# Patient Record
Sex: Female | Born: 1955 | Race: Black or African American | Hispanic: No | Marital: Single | Smoking: Never smoker
Health system: Southern US, Community
[De-identification: ages and names within clinical notes are randomized; demographics above are authoritative.]

## PROBLEM LIST (undated history)

## (undated) DIAGNOSIS — I10 Essential (primary) hypertension: Secondary | ICD-10-CM

## (undated) DIAGNOSIS — E785 Hyperlipidemia, unspecified: Secondary | ICD-10-CM

## (undated) HISTORY — DX: Essential (primary) hypertension: I10

## (undated) HISTORY — DX: Hyperlipidemia, unspecified: E78.5

---

## 2017-07-30 LAB — TSH: TSH: 0.24 — AB (ref <=5.90)

## 2017-10-07 LAB — TSH: TSH: 0.03 — AB (ref <=5.90)

## 2017-11-16 ENCOUNTER — Encounter: Payer: Self-pay | Admitting: "Endocrinology

## 2017-11-16 ENCOUNTER — Ambulatory Visit (INDEPENDENT_AMBULATORY_CARE_PROVIDER_SITE_OTHER): Payer: Managed Care, Other (non HMO) | Admitting: "Endocrinology

## 2017-11-16 VITALS — BP 122/79 | HR 74 | Ht 69.0 in | Wt 222.0 lb

## 2017-11-16 DIAGNOSIS — R7989 Other specified abnormal findings of blood chemistry: Secondary | ICD-10-CM | POA: Insufficient documentation

## 2017-11-16 NOTE — Progress Notes (Signed)
Endocrinology Consult Note                                            11/16/2017, 3:50 PM   Subjective:    Patient ID: Margaret Castro, female    DOB: 08-31-1955, PCP Verlee MonteWeeks, Susan R, NP   Past Medical History:  Diagnosis Date  . Hyperlipidemia   . Hypertension    History reviewed. No pertinent surgical history. Social History   Socioeconomic History  . Marital status: Married    Spouse name: Not on file  . Number of children: Not on file  . Years of education: Not on file  . Highest education level: Not on file  Occupational History  . Not on file  Social Needs  . Financial resource strain: Not on file  . Food insecurity:    Worry: Not on file    Inability: Not on file  . Transportation needs:    Medical: Not on file    Non-medical: Not on file  Tobacco Use  . Smoking status: Never Smoker  . Smokeless tobacco: Never Used  Substance and Sexual Activity  . Alcohol use: Not Currently  . Drug use: Never  . Sexual activity: Not on file  Lifestyle  . Physical activity:    Days per week: Not on file    Minutes per session: Not on file  . Stress: Not on file  Relationships  . Social connections:    Talks on phone: Not on file    Gets together: Not on file    Attends religious service: Not on file    Active member of club or organization: Not on file    Attends meetings of clubs or organizations: Not on file    Relationship status: Not on file  Other Topics Concern  . Not on file  Social History Narrative  . Not on file   Outpatient Encounter Medications as of 11/16/2017  Medication Sig  . lisinopril-hydrochlorothiazide (PRINZIDE,ZESTORETIC) 10-12.5 MG tablet daily.  . rosuvastatin (CRESTOR) 20 MG tablet 10 mg daily.   No facility-administered encounter medications on file as of 11/16/2017.    ALLERGIES: No Known Allergies  VACCINATION STATUS:  There is no immunization history on file for this patient.  HPI Margaret Castro is 62 y.o. female  who presents today with a medical history as above. she is being seen in consultation for abnormal thyroid function test requested by Verlee MonteWeeks, Susan R, NP. She was found to have suppressed TSH on 2 separate occasions, on July 30, 2017 0.235 and on October 07, 2017 0.032, no associated T4/T3.  -She denies recent weight change, palpitations, tremors.  She reports sleep disturbance, and fatigue.  -She has 2 sisters with thyroid problems, 1 of them status post thyroidectomy on thyroid hormone replacement.  She denies family history of thyroid cancer. -She denies any exposure to neck radiation.  She is not currently on antithyroid medications nor thyroid hormone supplements. -She denies heat/cold intolerance, dysphagia, shortness of breath, change in her voice.    Review of Systems  Constitutional: no weight gain/loss, no fatigue, no subjective hyperthermia, no subjective hypothermia Eyes: no blurry vision, no xerophthalmia ENT: no sore throat, no nodules palpated in throat, no dysphagia/odynophagia, no hoarseness Cardiovascular: no Chest Pain, no Shortness of Breath, no palpitations, no leg swelling Respiratory: no cough, no SOB Gastrointestinal: no  Nausea/Vomiting/Diarhhea Musculoskeletal: no muscle/joint aches Skin: no rashes Neurological: no tremors, no numbness, no tingling, no dizziness Psychiatric: no depression, no anxiety  Objective:    BP 122/79   Pulse 74   Ht 5\' 9"  (1.753 m)   Wt 222 lb (100.7 kg)   BMI 32.78 kg/m   Wt Readings from Last 3 Encounters:  11/16/17 222 lb (100.7 kg)    Physical Exam  Constitutional: + Obese,  not in acute distress, normal state of mind Eyes: PERRLA, EOMI, no exophthalmos ENT: moist mucous membranes, no thyromegaly, no cervical lymphadenopathy Cardiovascular: normal precordial activity, Regular Rate and Rhythm, no Murmur/Rubs/Gallops Respiratory:  adequate breathing efforts, no gross chest deformity, Clear to auscultation  bilaterally Gastrointestinal: abdomen soft, Non -tender, No distension, Bowel Sounds present Musculoskeletal: no gross deformities, strength intact in all four extremities Skin: moist, warm, no rashes Neurological: + Mild tremor of outstretched hands, Deep tendon reflexes normal in all four extremities.  Recent Results (from the past 2160 hour(s))  TSH     Status: Abnormal   Collection Time: 10/07/17 12:00 AM  Result Value Ref Range   TSH 0.03 (A) 0.41 - 5.90      Assessment & Plan:   1. Abnormal thyroid blood test - Margaret Castro  is being seen at a kind request of Weeks, Lenny Pastel, NP. - I have reviewed her available thyroid records and clinically evaluated the patient. - Based on  reviews, she has suppressed TSH on 2 separate occasions , suggestive of hyperthyroidism.  This is not sufficient information to proceed with treatment plan.  -she will need a repeat,  more complete thyroid function tests including TSH, free T4/free T3, and antithyroid antibodies towards confirming the diagnosis.  -she will return in 1 week to review her repeat labs.   If her labs are suggestive of hyperthyroidism, she would be considered for thyroid uptake and scan to confirm diagnosis.  - I did not initiate any new prescriptions today. - I advised patient to maintain close follow up with Verlee Monte, NP for primary care needs. -She does not have gross  goiter, no need for thyroid imaging at this time.  - Time spent with the patient: 45 minutes, of which >50% was spent in obtaining information about her symptoms, reviewing her previous labs, evaluations, and treatments, counseling her about her normal thyroid function tests, and developing a plan to confirm the diagnosis and long term treatment as necessary.  Margaret Castro participated in the discussions, expressed understanding, and voiced agreement with the above plans.  All questions were answered to her satisfaction. she is encouraged to  contact clinic should she have any questions or concerns prior to her return visit.  Follow up plan: Return in about 1 week (around 11/23/2017) for labs today.   Marquis Lunch, MD [facility] [address] [place], [place] [address] Phone: [phone]  Fax: [phone]     11/16/2017, 3:50 PM  This note was partially dictated with voice recognition software. Similar sounding words can be transcribed inadequately or may not  be corrected upon review.

## 2017-11-17 LAB — T4, FREE: FREE T4: 1.7 ng/dL (ref 0.8–1.8)

## 2017-11-17 LAB — THYROGLOBULIN ANTIBODY: Thyroglobulin Ab: 3 IU/mL — ABNORMAL HIGH (ref <=1)

## 2017-11-17 LAB — T3, FREE: T3, Free: 4.3 pg/mL — ABNORMAL HIGH (ref 2.3–4.2)

## 2017-11-17 LAB — THYROID PEROXIDASE ANTIBODY: Thyroperoxidase Ab SerPl-aCnc: 2 IU/mL (ref <9)

## 2017-11-17 LAB — TSH: TSH: 0.01 m[IU]/L — AB (ref 0.40–4.50)

## 2017-11-23 ENCOUNTER — Encounter: Payer: Self-pay | Admitting: "Endocrinology

## 2017-11-23 ENCOUNTER — Ambulatory Visit (INDEPENDENT_AMBULATORY_CARE_PROVIDER_SITE_OTHER): Payer: Managed Care, Other (non HMO) | Admitting: "Endocrinology

## 2017-11-23 VITALS — BP 137/83 | HR 75 | Ht 69.0 in | Wt 222.0 lb

## 2017-11-23 DIAGNOSIS — E0590 Thyrotoxicosis, unspecified without thyrotoxic crisis or storm: Secondary | ICD-10-CM | POA: Diagnosis not present

## 2017-11-23 NOTE — Progress Notes (Signed)
Endocrinology follow-up note                                            11/23/2017, 4:45 PM   Subjective:    Patient ID: Margaret Castro, female    DOB: Jun 19, 1956, PCP Verlee Monte, NP   Past Medical History:  Diagnosis Date  . Hyperlipidemia   . Hypertension    History reviewed. No pertinent surgical history. Social History   Socioeconomic History  . Marital status: Married    Spouse name: Not on file  . Number of children: Not on file  . Years of education: Not on file  . Highest education level: Not on file  Occupational History  . Not on file  Social Needs  . Financial resource strain: Not on file  . Food insecurity:    Worry: Not on file    Inability: Not on file  . Transportation needs:    Medical: Not on file    Non-medical: Not on file  Tobacco Use  . Smoking status: Never Smoker  . Smokeless tobacco: Never Used  Substance and Sexual Activity  . Alcohol use: Not Currently  . Drug use: Never  . Sexual activity: Not on file  Lifestyle  . Physical activity:    Days per week: Not on file    Minutes per session: Not on file  . Stress: Not on file  Relationships  . Social connections:    Talks on phone: Not on file    Gets together: Not on file    Attends religious service: Not on file    Active member of club or organization: Not on file    Attends meetings of clubs or organizations: Not on file    Relationship status: Not on file  Other Topics Concern  . Not on file  Social History Narrative  . Not on file   Outpatient Encounter Medications as of 11/23/2017  Medication Sig  . lisinopril-hydrochlorothiazide (PRINZIDE,ZESTORETIC) 10-12.5 MG tablet daily.  . rosuvastatin (CRESTOR) 20 MG tablet 10 mg daily.   No facility-administered encounter medications on file as of 11/23/2017.    ALLERGIES: No Known Allergies  VACCINATION STATUS:  There is no immunization history on file for this patient.  HPI Margaret Castro is 62 y.o. female  who presents today with a medical history as above. she is returning with repeat full set thyroid function test after she was seen in consultation for incomplete abnormal thyroid function test.   Her repeat labs suggest hyperthyroidism. -She denies recent weight change, palpitations, tremors.  She reports sleep disturbance, and fatigue.  -She has 2 sisters with thyroid problems, 1 of them status post thyroidectomy on thyroid hormone replacement.  She denies family history of thyroid cancer. -She denies any exposure to neck radiation.  She is not currently on antithyroid medications nor thyroid hormone supplements. -She denies heat/cold intolerance, dysphagia, shortness of breath, change in her voice.   Review of Systems  Constitutional: + Steady weight,  + fatigue, no subjective hyperthermia, no subjective hypothermia Eyes: no blurry vision, no xerophthalmia ENT: no sore throat, no nodules palpated in throat, no dysphagia/odynophagia, no hoarseness Cardiovascular: + No chest pain, no palpitations.  Respiratory: no cough, no SOB Gastrointestinal: no Nausea/Vomiting/Diarhhea Musculoskeletal: no muscle/joint aches Skin: no rashes Neurological: no tremors, no numbness, no tingling, no dizziness Psychiatric: no  depression, no anxiety  Objective:    BP 137/83   Pulse 75   Ht  (1.753 m)   Wt 222 lb (100.7 kg)   BMI 32.78 kg/m   Wt Readings from Last 3 Encounters:  11/23/17 222 lb (100.7 kg)  11/16/17 222 lb (100.7 kg)    Physical Exam  Constitutional: + Obese,  not in acute distress, normal state of mind Eyes: PERRLA, EOMI, no exophthalmos ENT: moist mucous membranes, no thyromegaly, no cervical lymphadenopathy  Musculoskeletal: no gross deformities, strength intact in all four extremities Skin: moist, warm, no rashes Neurological: + Mild tremors of outstretched hands   Recent Results (from the past 2160 hour(s))  TSH     Status: Abnormal   Collection Time: 10/07/17 12:00  AM  Result Value Ref Range   TSH 0.03 (A) 0.41 - 5.90  Thyroid peroxidase antibody     Status: None   Collection Time: 11/16/17  4:03 PM  Result Value Ref Range   Thyroperoxidase Ab SerPl-aCnc 2 <9 IU/mL  T3, free     Status: Abnormal   Collection Time: 11/16/17  4:03 PM  Result Value Ref Range   T3, Free 4.3 (H) 2.3 - 4.2 pg/mL  T4, free     Status: None   Collection Time: 11/16/17  4:03 PM  Result Value Ref Range   Free T4 1.7 0.8 - 1.8 ng/dL  TSH     Status: Abnormal   Collection Time: 11/16/17  4:03 PM  Result Value Ref Range   TSH 0.01 (L) 0.40 - 4.50 mIU/L  Thyroglobulin antibody     Status: Abnormal   Collection Time: 11/16/17  4:03 PM  Result Value Ref Range   Thyroglobulin Ab 3 (H) < or = 1 IU/mL     Assessment & Plan:   1.  Hyperthyroidism -Her repeat thyroid function tests are suggestive of hyperthyroidism of autoimmune etiology. -She may require definitive treatment with preferably radioactive iodine ablation. -She will need a confirmatory test with thyroid uptake and scan which will be scheduled to be done as soon as possible.  -she will return in 2  weekS to review her repeat labs.  - I did not initiate any new prescriptions today. - I advised patient to maintain close follow up with [name], NP for primary care needs.  Margaret Castro participated in the discussions, expressed understanding, and voiced agreement with the above plans.  All questions were answered to her satisfaction. she is encouraged to contact clinic should she have any questions or concerns prior to her return visit.  Follow up plan: Return in about 2 weeks (around 12/07/2017) for follow up with thyroid uptake and scan.   Marquis Lunch, MD [facility] [address] [place], [place] [address] Phone: [phone]  Fax: [phone]     11/23/2017, 4:45 PM  This note was partially dictated with voice recognition software.  Similar sounding words can be transcribed inadequately or may not  be corrected upon review.

## 2017-11-30 ENCOUNTER — Ambulatory Visit: Payer: Managed Care, Other (non HMO) | Admitting: "Endocrinology

## 2017-12-01 ENCOUNTER — Encounter (HOSPITAL_COMMUNITY)
Admission: RE | Admit: 2017-12-01 | Discharge: 2017-12-01 | Disposition: A | Discharge status: Home / Self Care | Payer: Managed Care, Other (non HMO) | Source: Ambulatory Visit | Attending: "Endocrinology | Admitting: "Endocrinology

## 2017-12-01 ENCOUNTER — Encounter (HOSPITAL_COMMUNITY): Payer: Self-pay

## 2017-12-01 DIAGNOSIS — E0590 Thyrotoxicosis, unspecified without thyrotoxic crisis or storm: Secondary | ICD-10-CM | POA: Diagnosis not present

## 2017-12-01 MED ORDER — SODIUM IODIDE I 131 CAPSULE
300.0000 | Freq: Once | INTRAVENOUS | Status: AC | PRN
  Administered 2017-12-01: 300 via ORAL

## 2017-12-02 ENCOUNTER — Encounter (HOSPITAL_COMMUNITY)
Admission: RE | Admit: 2017-12-02 | Discharge: 2017-12-02 | Disposition: A | Discharge status: Home / Self Care | Payer: Managed Care, Other (non HMO) | Source: Ambulatory Visit | Attending: "Endocrinology | Admitting: "Endocrinology

## 2017-12-08 ENCOUNTER — Ambulatory Visit (INDEPENDENT_AMBULATORY_CARE_PROVIDER_SITE_OTHER): Payer: Managed Care, Other (non HMO) | Admitting: "Endocrinology

## 2017-12-08 ENCOUNTER — Encounter: Payer: Self-pay | Admitting: "Endocrinology

## 2017-12-08 VITALS — BP 138/82 | HR 76 | Ht 69.0 in | Wt 223.0 lb

## 2017-12-08 DIAGNOSIS — E0590 Thyrotoxicosis, unspecified without thyrotoxic crisis or storm: Secondary | ICD-10-CM

## 2017-12-08 MED ORDER — METHIMAZOLE 5 MG PO TABS: 5.0000 mg | ORAL_TABLET | Freq: Every day | ORAL | 3 refills | Status: DC

## 2017-12-08 NOTE — Progress Notes (Signed)
Endocrinology follow-up note                                            12/08/2017, 4:47 PM   Subjective:    Patient ID: Margaret Castro, female    DOB: 1955/10/13, PCP Verlee Monte, NP   Past Medical History:  Diagnosis Date  . Hyperlipidemia   . Hypertension    History reviewed. No pertinent surgical history. Social History   Socioeconomic History  . Marital status: Single    Spouse name: Not on file  . Number of children: Not on file  . Years of education: Not on file  . Highest education level: Not on file  Occupational History  . Not on file  Social Needs  . Financial resource strain: Not on file  . Food insecurity:    Worry: Not on file    Inability: Not on file  . Transportation needs:    Medical: Not on file    Non-medical: Not on file  Tobacco Use  . Smoking status: Never Smoker  . Smokeless tobacco: Never Used  Substance and Sexual Activity  . Alcohol use: Not Currently  . Drug use: Never  . Sexual activity: Not on file  Lifestyle  . Physical activity:    Days per week: Not on file    Minutes per session: Not on file  . Stress: Not on file  Relationships  . Social connections:    Talks on phone: Not on file    Gets together: Not on file    Attends religious service: Not on file    Active member of club or organization: Not on file    Attends meetings of clubs or organizations: Not on file    Relationship status: Not on file  Other Topics Concern  . Not on file  Social History Narrative  . Not on file   Outpatient Encounter Medications as of 12/08/2017  Medication Sig  . lisinopril-hydrochlorothiazide (PRINZIDE,ZESTORETIC) 10-12.5 MG tablet daily.  . methimazole (TAPAZOLE) 5 MG tablet Take 1 tablet (5 mg total) by mouth daily.  . rosuvastatin (CRESTOR) 20 MG tablet 10 mg daily.   No facility-administered encounter medications on file as of 12/08/2017.    ALLERGIES: No Known Allergies  VACCINATION STATUS:  There is no  immunization history on file for this patient.  HPI Margaret Castro is 62 y.o. female who presents today with a medical history as above. she is returning with thyroid uptake and scan in work-up of her recent diagnosis of hyperthyroidism.  -She is not on any antithyroid medication at this time.  Her recent thyroid function tests were consistent with hyperthyroidism. -She denies recent weight change, palpitations, tremors.  She reports sleep disturbance, and fatigue.  -She has 2 sisters with thyroid problems, 1 of them status post thyroidectomy on thyroid hormone replacement.  She denies family history of thyroid cancer. -She denies any exposure to neck radiation.  She is not currently on antithyroid medications nor thyroid hormone supplements. -She denies heat/cold intolerance, dysphagia, shortness of breath, change in her voice.   Review of Systems  Constitutional: + Steady weight,  + fatigue, no subjective hyperthermia, no subjective hypothermia Eyes: no blurry vision, no xerophthalmia ENT: no sore throat, no nodules palpated in throat, no dysphagia/odynophagia, no hoarseness Cardiovascular: + No chest pain, no palpitations.  Respiratory: no  cough, no SOB Gastrointestinal: no Nausea/Vomiting/Diarhhea Musculoskeletal: no muscle/joint aches Skin: no rashes Neurological: no tremors, no numbness, no tingling, no dizziness Psychiatric: no depression, no anxiety  Objective:    BP 138/82   Pulse 76   Ht  (1.753 m)   Wt 223 lb (101.2 kg)   BMI 32.93 kg/m   Wt Readings from Last 3 Encounters:  12/08/17 223 lb (101.2 kg)  11/23/17 222 lb (100.7 kg)  11/16/17 222 lb (100.7 kg)    Physical Exam  Constitutional: + Obese,  not in acute distress, normal state of mind Eyes: PERRLA, EOMI, no exophthalmos ENT: moist mucous membranes, no thyromegaly, no cervical lymphadenopathy  Musculoskeletal: no gross deformities, strength intact in all four extremities Skin: moist, warm, no  rashes Neurological: + Mild tremors of outstretched hands.      Recent Results (from the past 2160 hour(s))  TSH     Status: Abnormal   Collection Time: 10/07/17 12:00 AM  Result Value Ref Range   TSH 0.03 (A) 0.41 - 5.90  Thyroid peroxidase antibody     Status: None   Collection Time: 11/16/17  4:03 PM  Result Value Ref Range   Thyroperoxidase Ab SerPl-aCnc 2 <9 IU/mL  T3, free     Status: Abnormal   Collection Time: 11/16/17  4:03 PM  Result Value Ref Range   T3, Free 4.3 (H) 2.3 - 4.2 pg/mL  T4, free     Status: None   Collection Time: 11/16/17  4:03 PM  Result Value Ref Range   Free T4 1.7 0.8 - 1.8 ng/dL  TSH     Status: Abnormal   Collection Time: 11/16/17  4:03 PM  Result Value Ref Range   TSH 0.01 (L) 0.40 - 4.50 mIU/L  Thyroglobulin antibody     Status: Abnormal   Collection Time: 11/16/17  4:03 PM  Result Value Ref Range   Thyroglobulin Ab 3 (H) < or = 1 IU/mL     Assessment & Plan:   1.  Hyperthyroidism -Her repeat thyroid function tests are suggestive of hyperthyroidism of autoimmune etiology. -Thyroid uptake and scan is consistent with uniform uptake of 27.4% with no focal abnormalities. -Based on these findings, she will not require ablative therapy.  However, she would benefit from low-dose Tapazole in an attempt to control her thyroid back to normal. -I discussed and initiated Tapazole 5 mg p.o. daily with plan to repeat thyroid function test in 3 months. -Side effects and expectations discussed with her.  - I advised patient to maintain close follow up with [name], NP for primary care needs.  [name] participated in the discussions, expressed understanding, and voiced agreement with the above plans.  All questions were answered to her satisfaction. she is encouraged to contact clinic should she have any questions or concerns prior to her return visit.  Follow up plan: Return in about 3 months (around 03/10/2018) for follow up with  pre-visit labs.   Marquis Lunch, MD [facility] [address] [place], [place] [address] Phone: [phone]  Fax: [phone]     12/08/2017, 4:47 PM  This note was partially dictated with voice recognition software. Similar sounding words can be transcribed inadequately or may not  be corrected upon review.

## 2018-03-11 ENCOUNTER — Ambulatory Visit: Payer: Managed Care, Other (non HMO) | Admitting: "Endocrinology

## 2018-03-23 ENCOUNTER — Encounter: Payer: Self-pay | Admitting: "Endocrinology

## 2018-03-23 ENCOUNTER — Ambulatory Visit (INDEPENDENT_AMBULATORY_CARE_PROVIDER_SITE_OTHER): Payer: Managed Care, Other (non HMO) | Admitting: "Endocrinology

## 2018-03-23 VITALS — BP 124/80 | HR 80 | Ht 69.0 in | Wt 221.0 lb

## 2018-03-23 DIAGNOSIS — E0590 Thyrotoxicosis, unspecified without thyrotoxic crisis or storm: Secondary | ICD-10-CM | POA: Diagnosis not present

## 2018-03-23 MED ORDER — METHIMAZOLE 5 MG PO TABS: 2.5000 mg | ORAL_TABLET | Freq: Every day | ORAL | 3 refills | Status: DC

## 2018-03-23 NOTE — Progress Notes (Signed)
Endocrinology follow-up note                                            03/23/2018, 11:31 AM   Subjective:    Patient ID: Margaret Castro, female    DOB: 05/21/1956, PCP Verlee MonteWeeks, Susan R, NP   Past Medical History:  Diagnosis Date  . Hyperlipidemia   . Hypertension    History reviewed. No pertinent surgical history. Social History   Socioeconomic History  . Marital status: Single    Spouse name: Not on file  . Number of children: Not on file  . Years of education: Not on file  . Highest education level: Not on file  Occupational History  . Not on file  Social Needs  . Financial resource strain: Not on file  . Food insecurity:    Worry: Not on file    Inability: Not on file  . Transportation needs:    Medical: Not on file    Non-medical: Not on file  Tobacco Use  . Smoking status: Never Smoker  . Smokeless tobacco: Never Used  Substance and Sexual Activity  . Alcohol use: Not Currently  . Drug use: Never  . Sexual activity: Not on file  Lifestyle  . Physical activity:    Days per week: Not on file    Minutes per session: Not on file  . Stress: Not on file  Relationships  . Social connections:    Talks on phone: Not on file    Gets together: Not on file    Attends religious service: Not on file    Active member of club or organization: Not on file    Attends meetings of clubs or organizations: Not on file    Relationship status: Not on file  Other Topics Concern  . Not on file  Social History Narrative  . Not on file   Outpatient Encounter Medications as of 03/23/2018  Medication Sig  . lisinopril-hydrochlorothiazide (PRINZIDE,ZESTORETIC) 10-12.5 MG tablet daily.  . methimazole (TAPAZOLE) 5 MG tablet Take 0.5 tablets (2.5 mg total) by mouth daily.  . rosuvastatin (CRESTOR) 20 MG tablet 10 mg daily.  . [DISCONTINUED] methimazole (TAPAZOLE) 5 MG tablet Take 1 tablet (5 mg total) by mouth daily.   No facility-administered encounter medications on  file as of 03/23/2018.    ALLERGIES: No Known Allergies  VACCINATION STATUS:  There is no immunization history on file for this patient.  HPI Margaret Castro is 62 y.o. female who presents today with a medical history as above. she is returning with repeat thyroid function tests for a follow-up of hyperthyroidism.  She is on methimazole 5 mg p.o. daily.  -She tolerates this medication very well.  She feels better.  No new complaints.  -She denies recent weight change, palpitations, tremors.  She sleeps better, denies fatigue.     -She has 2 sisters with thyroid problems, 1 of them status post thyroidectomy on thyroid hormone replacement.  She denies family history of thyroid cancer. -She denies any exposure to neck radiation.   -She denies heat/cold intolerance, dysphagia, shortness of breath, change in her voice.   Review of Systems  Constitutional: + Steady weight,  + fatigue, no subjective hyperthermia, no subjective hypothermia Eyes: no blurry vision, no xerophthalmia ENT: no sore throat, no nodules palpated in throat, no dysphagia/odynophagia, no hoarseness  Cardiovascular:  No chest pain, no palpitations.  Respiratory: no cough, no SOB Gastrointestinal: no Nausea/Vomiting/Diarhhea Musculoskeletal: no muscle/joint aches Skin: no rashes Neurological: no tremors, no numbness, no tingling, no dizziness Psychiatric: no depression, no anxiety  Objective:    BP 124/80   Pulse 80   Ht 5\' 9"  (1.753 m)   Wt 221 lb (100.2 kg)   BMI 32.64 kg/m   Wt Readings from Last 3 Encounters:  03/23/18 221 lb (100.2 kg)  12/08/17 223 lb (101.2 kg)  11/23/17 222 lb (100.7 kg)    Physical Exam  Constitutional: + Obese,  not in acute distress, normal state of mind Eyes: PERRLA, EOMI, no exophthalmos ENT: moist mucous membranes, no thyromegaly, no cervical lymphadenopathy  Musculoskeletal: no gross deformities, strength intact in all four extremities Skin: moist, warm, no  rashes Neurological: - tremors of outstretched hands.     March 03, 2018 labs showed normal CBC, free T4 1.2.  Assessment & Plan:   1.  Hyperthyroidism -She is responding to low-dose methimazole therapy, with clinical and biochemical stabilization.  She has hyperthyroidism of autoimmune etiology. -Thyroid uptake and scan is consistent with uniform uptake of 27.4% with no focal abnormalities.  She did not require ablative treatment. I advised her to lower her methimazole to 2.5 mg p.o. daily with plan to repeat thyroid function test in 3 to 4 months.  -Side effects and expectations discussed with her.  - I advised patient to maintain close follow up with [name], NP for primary care needs.  Margaret Ave participated in the discussions, expressed understanding, and voiced agreement with the above plans.  All questions were answered to her satisfaction. she is encouraged to contact clinic should she have any questions or concerns prior to her return visit.  Follow up plan: Return in about 4 months (around 07/23/2018) for Follow up with Pre-visit Labs.   Marquis Lunch, MD [facility] [address] [place], [place] [address] Phone: [phone]  Fax: [phone]     03/23/2018, 11:31 AM  This note was partially dictated with voice recognition software. Similar sounding words can be transcribed inadequately or may not  be corrected upon review.

## 2018-07-18 LAB — BASIC METABOLIC PANEL
BUN: 14 (ref 4–21)
Creatinine: 1 (ref 0.5–1.1)

## 2018-07-18 LAB — LIPID PANEL
Cholesterol: 148 (ref 0–200)
HDL: 59 (ref 35–70)
LDL Cholesterol: 79
Triglycerides: 49 (ref 40–160)

## 2018-07-18 LAB — TSH: TSH: 0.01 — AB (ref <=5.90)

## 2018-07-25 ENCOUNTER — Encounter: Payer: Self-pay | Admitting: "Endocrinology

## 2018-07-25 ENCOUNTER — Ambulatory Visit (INDEPENDENT_AMBULATORY_CARE_PROVIDER_SITE_OTHER): Payer: Managed Care, Other (non HMO) | Admitting: "Endocrinology

## 2018-07-25 VITALS — BP 127/80 | HR 76 | Ht 69.0 in | Wt 215.0 lb

## 2018-07-25 DIAGNOSIS — E0590 Thyrotoxicosis, unspecified without thyrotoxic crisis or storm: Secondary | ICD-10-CM | POA: Diagnosis not present

## 2018-07-25 MED ORDER — METHIMAZOLE 5 MG PO TABS
5.0000 mg | ORAL_TABLET | Freq: Every day | ORAL | 3 refills | Status: DC
Start: 2018-07-25 — End: 2018-09-02

## 2018-07-25 NOTE — Progress Notes (Signed)
Endocrinology follow-up note                                            07/25/2018, 1:27 PM   Subjective:    Patient ID: Margaret AveVanessa D Guardado, female    DOB: 1956/04/07, PCP Verlee MonteWeeks, Susan R, NP   Past Medical History:  Diagnosis Date  . Hyperlipidemia   . Hypertension    History reviewed. No pertinent surgical history. Social History   Socioeconomic History  . Marital status: Single    Spouse name: Not on file  . Number of children: Not on file  . Years of education: Not on file  . Highest education level: Not on file  Occupational History  . Not on file  Social Needs  . Financial resource strain: Not on file  . Food insecurity:    Worry: Not on file    Inability: Not on file  . Transportation needs:    Medical: Not on file    Non-medical: Not on file  Tobacco Use  . Smoking status: Never Smoker  . Smokeless tobacco: Never Used  Substance and Sexual Activity  . Alcohol use: Not Currently  . Drug use: Never  . Sexual activity: Not on file  Lifestyle  . Physical activity:    Days per week: Not on file    Minutes per session: Not on file  . Stress: Not on file  Relationships  . Social connections:    Talks on phone: Not on file    Gets together: Not on file    Attends religious service: Not on file    Active member of club or organization: Not on file    Attends meetings of clubs or organizations: Not on file    Relationship status: Not on file  Other Topics Concern  . Not on file  Social History Narrative  . Not on file   Outpatient Encounter Medications as of 07/25/2018  Medication Sig  . lisinopril-hydrochlorothiazide (PRINZIDE,ZESTORETIC) 10-12.5 MG tablet daily.  . methimazole (TAPAZOLE) 5 MG tablet Take 1 tablet (5 mg total) by mouth daily.  . rosuvastatin (CRESTOR) 20 MG tablet 10 mg daily.  . [DISCONTINUED] methimazole (TAPAZOLE) 5 MG tablet Take 0.5 tablets (2.5 mg total) by mouth daily.   No facility-administered encounter medications on  file as of 07/25/2018.    ALLERGIES: No Known Allergies  VACCINATION STATUS:  There is no immunization history on file for this patient.  HPI Margaret Castro is 62 y.o. female who presents today with a medical history as above. she is returning with repeat thyroid function tests for a follow-up of hyperthyroidism.  She is on methimazole 2.5 mg p.o. daily.  She admits that she has been inconsistent taking this medication.  She skips 2-3 doses per week.  She presents with unintentional weight loss of 6 pounds, and tremors.  -She denies palpitations nor heat intolerance.   -She has 2 sisters with thyroid problems, 1 of them status post thyroidectomy on thyroid hormone replacement.  She denies family history of thyroid cancer. -She denies any exposure to neck radiation.   -She denies heat/cold intolerance, dysphagia, shortness of breath, change in her voice.   Review of Systems  Constitutional: + weight loss  + fatigue, no subjective hyperthermia, no subjective hypothermia Eyes: no blurry vision, no xerophthalmia ENT: no sore throat, no nodules palpated  in throat, no dysphagia/odynophagia, no hoarseness Musculoskeletal: no muscle/joint aches Skin: no rashes Neurological: no tremors, no numbness, no tingling, no dizziness Psychiatric: no depression, no anxiety  Objective:    BP 127/80   Pulse 76   Ht 5\' 9"  (1.753 m)   Wt 215 lb (97.5 kg)   BMI 31.75 kg/m   Wt Readings from Last 3 Encounters:  07/25/18 215 lb (97.5 kg)  03/23/18 221 lb (100.2 kg)  12/08/17 223 lb (101.2 kg)    Physical Exam  Constitutional: + Obese,  not in acute distress, normal state of mind Eyes: PERRLA, EOMI, no exophthalmos ENT: moist mucous membranes, no thyromegaly, no cervical lymphadenopathy  Musculoskeletal: no gross deformities, strength intact in all four extremities Skin: moist, warm, no rashes Neurological: +tremors of outstretched hands.   Recent Results (from the past 2160 hour(s))   Basic metabolic panel     Status: None   Collection Time: 07/18/18 12:00 AM  Result Value Ref Range   BUN 14 4 - 21   Creatinine 1.0 0.5 - 1.1  Lipid panel     Status: None   Collection Time: 07/18/18 12:00 AM  Result Value Ref Range   Triglycerides 49 40 - 160   Cholesterol 148 0 - 200   HDL 59 35 - 70   LDL Cholesterol 79   TSH     Status: Abnormal   Collection Time: 07/18/18 12:00 AM  Result Value Ref Range   TSH 0.01 (A) 0.41 - 5.90      March 03, 2018 labs showed normal CBC, free T4 1.2.  Assessment & Plan:   1.  Hyperthyroidism -She presents with reversed course of hyperthyroidism clinically with significantly suppressed TSH, this is mainly due to her inconsistency in taking her methimazole.  I approached her with better engagement and increased methimazole to 5 mg p.o. daily with plan to repeat thyroid function test in 8 weeks.    She has hyperthyroidism of autoimmune etiology. -Thyroid uptake and scan is consistent with uniform uptake of 27.4% with no focal abnormalities.  She did not require ablative treatment.  -She will be considered for radioactive iodine thyroid ablation if her response to consistent use of methimazole is not satisfactory.   - I advised patient to maintain close follow up with Tania AdeWeeks, Lenny PastelSusan R, NP for primary care needs.  Margaret AveVanessa D Labarbera participated in the discussions, expressed understanding, and voiced agreement with the above plans.  All questions were answered to her satisfaction. she is encouraged to contact clinic should she have any questions or concerns prior to her return visit.  Follow up plan: Return in about 9 weeks (around 09/26/2018) for Follow up with Pre-visit Labs.   Marquis LunchGebre Samera Macy, MD [facility] [address] [place], [place] [address] Phone: [phone]  Fax: [phone]     07/25/2018, 1:27 PM  This note was partially dictated with voice recognition software. Similar  sounding words can be transcribed inadequately or may not  be corrected upon review.

## 2018-09-02 ENCOUNTER — Other Ambulatory Visit: Payer: Self-pay | Admitting: "Endocrinology

## 2018-09-26 ENCOUNTER — Encounter: Payer: Self-pay | Admitting: "Endocrinology

## 2018-09-26 ENCOUNTER — Ambulatory Visit (INDEPENDENT_AMBULATORY_CARE_PROVIDER_SITE_OTHER): Payer: Managed Care, Other (non HMO) | Admitting: "Endocrinology

## 2018-09-26 VITALS — BP 129/80 | HR 93 | Resp 12 | Ht 69.0 in | Wt 218.8 lb

## 2018-09-26 DIAGNOSIS — E0590 Thyrotoxicosis, unspecified without thyrotoxic crisis or storm: Secondary | ICD-10-CM | POA: Diagnosis not present

## 2018-09-26 MED ORDER — METHIMAZOLE 5 MG PO TABS: 5.0000 mg | ORAL_TABLET | Freq: Every day | ORAL | 1 refills | Status: DC

## 2018-09-26 NOTE — Progress Notes (Signed)
Endocrinology follow-up note                                            09/26/2018, 3:41 PM   Subjective:    Patient ID: Margaret Castro, female    DOB: 1956-04-22, PCP Verlee Monte, NP   Past Medical History:  Diagnosis Date  . Hyperlipidemia   . Hypertension    History reviewed. No pertinent surgical history. Social History   Socioeconomic History  . Marital status: Single    Spouse name: Not on file  . Number of children: Not on file  . Years of education: Not on file  . Highest education level: Not on file  Occupational History  . Not on file  Social Needs  . Financial resource strain: Not on file  . Food insecurity:    Worry: Not on file    Inability: Not on file  . Transportation needs:    Medical: Not on file    Non-medical: Not on file  Tobacco Use  . Smoking status: Never Smoker  . Smokeless tobacco: Never Used  Substance and Sexual Activity  . Alcohol use: Not Currently  . Drug use: Never  . Sexual activity: Not on file  Lifestyle  . Physical activity:    Days per week: Not on file    Minutes per session: Not on file  . Stress: Not on file  Relationships  . Social connections:    Talks on phone: Not on file    Gets together: Not on file    Attends religious service: Not on file    Active member of club or organization: Not on file    Attends meetings of clubs or organizations: Not on file    Relationship status: Not on file  Other Topics Concern  . Not on file  Social History Narrative  . Not on file   Outpatient Encounter Medications as of 09/26/2018  Medication Sig  . lisinopril-hydrochlorothiazide (PRINZIDE,ZESTORETIC) 10-12.5 MG tablet daily.  . methimazole (TAPAZOLE) 5 MG tablet Take 1 tablet (5 mg total) by mouth daily.  . rosuvastatin (CRESTOR) 20 MG tablet 10 mg daily.  . [DISCONTINUED] methimazole (TAPAZOLE) 5 MG tablet Take 1 tablet (5 mg total) by mouth daily.   No facility-administered encounter medications on file as of  09/26/2018.    ALLERGIES: No Known Allergies  VACCINATION STATUS:  There is no immunization history on file for this patient.  HPI Margaret Castro is 63 y.o. female who presents today with a medical history as above. she is returning with repeat thyroid function tests for a follow-up of hyperthyroidism.  She is on methimazole 5 mg p.o. daily .  She reports better consistency taking this medication this time.   -She reports feeling better, denies heat intolerance , nurse, palpitations.  -She has 2 sisters with thyroid problems, 1 of them status post thyroidectomy on thyroid hormone replacement.  She denies family history of thyroid cancer. -She denies any exposure to neck radiation.   -She denies heat/cold intolerance, dysphagia, shortness of breath, change in her voice.   Review of Systems  Constitutional: + Minimally fluctuating body weight, - fatigue, no subjective hyperthermia, no subjective hypothermia Eyes: no blurry vision, no xerophthalmia ENT: no sore throat, no nodules palpated in throat, no dysphagia/odynophagia, no hoarseness Musculoskeletal: no muscle/joint aches Skin: no rashes Neurological:  no tremors, no numbness, no tingling, no dizziness Psychiatric: no depression, no anxiety  Objective:    BP 129/80   Pulse 93   Resp 12   Ht 5\' 9"  (1.753 m)   Wt 218 lb 12.8 oz (99.2 kg)   SpO2 100% Comment: room air  BMI 32.31 kg/m   Wt Readings from Last 3 Encounters:  09/26/18 218 lb 12.8 oz (99.2 kg)  07/25/18 215 lb (97.5 kg)  03/23/18 221 lb (100.2 kg)    Physical Exam  Constitutional: + Obese,  not in acute distress, normal state of mind Eyes: PERRLA, EOMI, no exophthalmos ENT: moist mucous membranes, no thyromegaly, no cervical lymphadenopathy  Musculoskeletal: no gross deformities, strength intact in all four extremities Skin: moist, warm, no rashes Neurological: +tremors of outstretched hands.    Recent Results (from the past 2160 hour(s))  Basic  metabolic panel     Status: None   Collection Time: 07/18/18 12:00 AM  Result Value Ref Range   BUN 14 4 - 21   Creatinine 1.0 0.5 - 1.1  Lipid panel     Status: None   Collection Time: 07/18/18 12:00 AM  Result Value Ref Range   Triglycerides 49 40 - 160   Cholesterol 148 0 - 200   HDL 59 35 - 70   LDL Cholesterol 79   TSH     Status: Abnormal   Collection Time: 07/18/18 12:00 AM  Result Value Ref Range   TSH 0.01 (A) 0.41 - 5.90      March 03, 2018 labs showed normal CBC, free T4 1.2.  Assessment & Plan:   1.  Hyperthyroidism -She presents with significantly improved thyroid function tests.  She is advised to continue methimazole 5 mg p.o. daily at breakfast with plan to repeat TSH/free T4 in 3 months with office visit.     She has hyperthyroidism of autoimmune etiology. -Thyroid uptake and scan is consistent with uniform uptake of 27.4% with no focal abnormalities.  She did not require ablative treatment.  -She will be considered for radioactive iodine thyroid ablation if her response to consistent use of methimazole is not satisfactory.   - I advised patient to maintain close follow up with [name], NP for primary care needs.   [name] participated in the discussions, expressed understanding, and voiced agreement with the above plans.  All questions were answered to her satisfaction. she is encouraged to contact clinic should she have any questions or concerns prior to her return visit.   Follow up plan: Return in about 3 months (around 12/27/2018) for Follow up with Pre-visit Labs.   Marquis Lunch, MD [facility] [address] [place], [place] [address] Phone: [phone]  Fax: [phone]     09/26/2018, 3:41 PM  This note was partially dictated with voice recognition software. Similar sounding words can be transcribed inadequately or may not  be corrected upon review.

## 2018-12-27 ENCOUNTER — Ambulatory Visit: Payer: Managed Care, Other (non HMO) | Admitting: "Endocrinology

## 2019-01-06 LAB — BASIC METABOLIC PANEL
BUN: 16 (ref 4–21)
Creatinine: 1.1 (ref 0.5–1.1)

## 2019-01-06 LAB — LIPID PANEL
Cholesterol: 175 (ref 0–200)
HDL: 69 (ref 35–70)
LDL Cholesterol: 95
Triglycerides: 55 (ref 40–160)

## 2019-01-07 LAB — TSH: TSH: 1.14 (ref 0.41–5.90)

## 2019-01-13 ENCOUNTER — Ambulatory Visit (INDEPENDENT_AMBULATORY_CARE_PROVIDER_SITE_OTHER): Payer: Managed Care, Other (non HMO) | Admitting: "Endocrinology

## 2019-01-13 ENCOUNTER — Encounter: Payer: Self-pay | Admitting: "Endocrinology

## 2019-01-13 ENCOUNTER — Other Ambulatory Visit: Payer: Self-pay

## 2019-01-13 DIAGNOSIS — E0590 Thyrotoxicosis, unspecified without thyrotoxic crisis or storm: Secondary | ICD-10-CM | POA: Diagnosis not present

## 2019-01-13 NOTE — Progress Notes (Signed)
01/13/2019, 1:20 PM                                Endocrinology Telehealth Visit Follow up Note -During COVID -19 Pandemic  I connected with Margaret Castro on 01/13/2019   by telephone and verified that I am speaking with the correct person using two identifiers. Margaret Castro, 05-29-1956. she has verbally consented to this visit. All issues noted in this document were discussed and addressed. The format was not optimal for physical exam.   Subjective:    Patient ID: Margaret Castro, female    DOB: Aug 21, 1955, PCP Renne Crigler, NP   Past Medical History:  Diagnosis Date  . Hyperlipidemia   . Hypertension    History reviewed. No pertinent surgical history. Social History   Socioeconomic History  . Marital status: Single    Spouse name: Not on file  . Number of children: Not on file  . Years of education: Not on file  . Highest education level: Not on file  Occupational History  . Not on file  Social Needs  . Financial resource strain: Not on file  . Food insecurity    Worry: Not on file    Inability: Not on file  . Transportation needs    Medical: Not on file    Non-medical: Not on file  Tobacco Use  . Smoking status: Never Smoker  . Smokeless tobacco: Never Used  Substance and Sexual Activity  . Alcohol use: Not Currently  . Drug use: Never  . Sexual activity: Not on file  Lifestyle  . Physical activity    Days per week: Not on file    Minutes per session: Not on file  . Stress: Not on file  Relationships  . Social Herbalist on phone: Not on file    Gets together: Not on file    Attends religious service: Not on file    Active member of club or organization: Not on file    Attends meetings of clubs or organizations: Not on file    Relationship status: Not on file  Other Topics Concern  . Not on file  Social History Narrative  . Not on file   Outpatient Encounter Medications as of  01/13/2019  Medication Sig  . lisinopril-hydrochlorothiazide (PRINZIDE,ZESTORETIC) 10-12.5 MG tablet daily.  . rosuvastatin (CRESTOR) 20 MG tablet 10 mg daily.  . [DISCONTINUED] methimazole (TAPAZOLE) 5 MG tablet Take 1 tablet (5 mg total) by mouth daily.   No facility-administered encounter medications on file as of 01/13/2019.    ALLERGIES: No Known Allergies  VACCINATION STATUS:  There is no immunization history on file for this patient.  HPI JASZMINE NAVEJAS is 63 y.o. female who presents today with a medical history as above. she is being engaged in telehealth for follow-up of hyperthyroidism.  She is on methimazole 5 mg p.o. daily.  She reports consistency taking her medication with clinical improvement with resolution of all of her symptoms.   -She has no new complaints today.   -She has 2 sisters with thyroid problems, 1 of them status post thyroidectomy on thyroid hormone  replacement.  She denies family history of thyroid cancer. -She denies any exposure to neck radiation.   -She denies heat/cold intolerance, dysphagia, shortness of breath, change in her voice.   Review of Systems -Limited as above.    Objective:    There were no vitals taken for this visit.  Wt Readings from Last 3 Encounters:  09/26/18 218 lb 12.8 oz (99.2 kg)  07/25/18 215 lb (97.5 kg)  03/23/18 221 lb (100.2 kg)    Physical Exam   Recent Results (from the past 2160 hour(s))  Basic metabolic panel     Status: None   Collection Time: 01/06/19 12:00 AM  Result Value Ref Range   BUN 16 4 - 21   Creatinine 1.1 0.5 - 1.1  Lipid panel     Status: None   Collection Time: 01/06/19 12:00 AM  Result Value Ref Range   Triglycerides 55 40 - 160   Cholesterol 175 0 - 200   HDL 69 35 - 70   LDL Cholesterol 95   TSH     Status: None   Collection Time: 01/06/19 12:00 AM  Result Value Ref Range   TSH 1.14 0.41 - 5.90    Comment: ft4 1.21, ft3 3.2      March 03, 2018 labs showed normal CBC, free  T4 1.2.  Assessment & Plan:   1.  Hyperthyroidism-resolved. Status post treatment with methimazole to the minimum dose of 5 mg p.o. daily.  Her previsit thyroid function tests are all within normal limits.  She will be taken off of her methimazole with plan to repeat thyroid function test in 4 months with office visit.   She has hyperthyroidism of autoimmune etiology. - Thyroid uptake and scan is consistent with uniform uptake of 27.4% with no focal abnormalities.  She did not require ablative treatment.  -She will be considered for radioactive iodine thyroid ablation if she returns with relapse of hyperthyroidism.   - I advised patient to maintain close follow up with Tania AdeWeeks, Lenny PastelSusan R, NP for primary care needs.  Time for this visit 15 minutes.  Loreta AveVanessa D Persichetti  participated in the discussions, expressed understanding, and voiced agreement with the above plans.  All questions were answered to her satisfaction. she is encouraged to contact clinic should she have any questions or concerns prior to her return visit.   Follow up plan: Return in about 4 months (around 05/15/2019) for Follow up with Pre-visit Labs.   Marquis LunchGebre Dwanna Goshert, MD [facility] [address] [place], [place] [address] Phone: [phone]  Fax: [phone]     01/13/2019, 1:20 PM  This note was partially dictated with voice recognition software. Similar sounding words can be transcribed inadequately or may not  be corrected upon review.

## 2019-05-17 ENCOUNTER — Ambulatory Visit: Payer: Managed Care, Other (non HMO) | Admitting: "Endocrinology

## 2019-09-01 LAB — TSH: TSH: 0.01 — AB (ref 0.41–5.90)

## 2019-09-06 ENCOUNTER — Ambulatory Visit (INDEPENDENT_AMBULATORY_CARE_PROVIDER_SITE_OTHER): Payer: Managed Care, Other (non HMO) | Admitting: "Endocrinology

## 2019-09-06 ENCOUNTER — Encounter: Payer: Self-pay | Admitting: "Endocrinology

## 2019-09-06 DIAGNOSIS — E0590 Thyrotoxicosis, unspecified without thyrotoxic crisis or storm: Secondary | ICD-10-CM | POA: Diagnosis not present

## 2019-09-06 LAB — HEMOGLOBIN A1C: Hemoglobin A1C: 5.1

## 2019-09-06 LAB — BASIC METABOLIC PANEL
BUN: 14 (ref 4–21)
Creatinine: 0.8 (ref 0.5–1.1)

## 2019-09-06 LAB — TSH: TSH: 0.01 — AB (ref 0.41–5.90)

## 2019-09-06 NOTE — Progress Notes (Signed)
09/06/2019, 9:04 PM                                           Endocrinology Telehealth Visit Follow up Note -During COVID -19 Pandemic  I connected with Margaret Castro on 09/06/2019   by telephone and verified that I am speaking with the correct person using two identifiers. Margaret Castro, 1955-09-30. she has verbally consented to this visit. All issues noted in this document were discussed and addressed. The format was not optimal for physical exam.   Subjective:    Patient ID: Margaret Castro, female    DOB: 28-May-1956, PCP Margaret Monte, NP   Past Medical History:  Diagnosis Date  . Hyperlipidemia   . Hypertension    History reviewed. No pertinent surgical history. Social History   Socioeconomic History  . Marital status: Single    Spouse name: Not on file  . Number of children: Not on file  . Years of education: Not on file  . Highest education level: Not on file  Occupational History  . Not on file  Tobacco Use  . Smoking status: Never Smoker  . Smokeless tobacco: Never Used  Substance and Sexual Activity  . Alcohol use: Not Currently  . Drug use: Never  . Sexual activity: Not on file  Other Topics Concern  . Not on file  Social History Narrative  . Not on file   Social Determinants of Health   Financial Resource Strain:   . Difficulty of Paying Living Expenses: Not on file  Food Insecurity:   . Worried About Programme researcher, broadcasting/film/video in the Last Year: Not on file  . Ran Out of Food in the Last Year: Not on file  Transportation Needs:   . Lack of Transportation (Medical): Not on file  . Lack of Transportation (Non-Medical): Not on file  Physical Activity:   . Days of Exercise per Week: Not on file  . Minutes of Exercise per Session: Not on file  Stress:   . Feeling of Stress : Not on file  Social Connections:   . Frequency of Communication with Friends and Family: Not on file  . Frequency of Social  Gatherings with Friends and Family: Not on file  . Attends Religious Services: Not on file  . Active Member of Clubs or Organizations: Not on file  . Attends Banker Meetings: Not on file  . Marital Status: Not on file   Outpatient Encounter Medications as of 09/06/2019  Medication Sig  . metoprolol succinate (TOPROL-XL) 25 MG 24 hr tablet Take 25 mg by mouth daily after breakfast.  . lisinopril-hydrochlorothiazide (PRINZIDE,ZESTORETIC) 10-12.5 MG tablet daily.  . rosuvastatin (CRESTOR) 20 MG tablet 10 mg daily.   No facility-administered encounter medications on file as of 09/06/2019.   ALLERGIES: No Known Allergies  VACCINATION STATUS:  There is no immunization history on file for this patient.  HPI [name] is 64 y.o. female who presents today with a medical history as above. she is being engaged in telehealth for follow-up of hyperthyroidism.  She was previously treated  with methimazole to euthyroid state.  Recently, she started to have palpitations, hot flashes, tremors which prompted her to go to her PCP.  She underwent thyroid function test on September 01, 2019 which showed suppressed TSH of less than 0.005.    -She has 2 sisters with thyroid problems, 1 of them status post thyroidectomy on thyroid hormone replacement.  Another sister with treatment which appears to be radioactive iodine ablation.  She denies family history of thyroid cancer. -She denies any exposure to neck radiation.   -She denies heat/cold intolerance, dysphagia, shortness of breath, change in her voice.   Review of Systems -Limited as above.    Objective:    There were no vitals taken for this visit.  Wt Readings from Last 3 Encounters:  09/26/18 218 lb 12.8 oz (99.2 kg)  07/25/18 215 lb (97.5 kg)  03/23/18 221 lb (100.2 kg)    Physical Exam  Recent Results (from the past 2160 hour(s))  TSH     Status: Abnormal   Collection Time: 09/01/19 12:00 AM  Result Value Ref Range    TSH 0.01 (A) 0.41 - 5.90     March 03, 2018 labs showed normal CBC, free T4 1.2.  Assessment & Plan:   1.  Hyperthyroidism- relapsing  She is status post methimazole treatment to euthyroid state in the past.  She presents with relapsing thyrotoxicosis.  She will be considered for confirmatory thyroid uptake and scan at her local facility in [facility].   This test will be ordered to be done as soon as possible.  She will be on follow-up visit in 7 to 10 days to discuss findings and treatment decisions if necessary. She is currently on metoprolol 25 mg XL p.o. daily, advised to continue.  -Her previous thyroid uptake and scan is consistent with uniform uptake of 27.4% with no focal abnormalities.  She did not require ablative treatment during that time.  -If her repeat thyroid uptake and scan shows significant uptake, she will be considered for I-131 thyroid ablation.    She understands the subsequent need for thyroid hormone replacement.     - I advised patient to maintain close follow up with Suella Grove Georgeanna Lea, NP for primary care needs.     - Time spent on this patient care encounter:  25 minutes of which 50% was spent in  counseling and the rest reviewing  her current and  previous labs / studies and medications  doses and developing a plan for long term care. Marylouise Stacks  participated in the discussions, expressed understanding, and voiced agreement with the above plans.  All questions were answered to her satisfaction. she is encouraged to contact clinic should she have any questions or concerns prior to her return visit.   Follow up plan: Return in about 1 week (around 09/13/2019) for Follow up with Thyroid Uptake and Scan.   Glade Lloyd, MD [facility] [place] [address] [place], [place] [address] Phone: [phone]  Fax: [phone]     09/06/2019, 9:04 PM  This note was partially dictated with voice  recognition software. Similar sounding words can be transcribed inadequately or may not  be corrected upon review.

## 2019-09-19 ENCOUNTER — Telehealth: Payer: Self-pay | Admitting: "Endocrinology

## 2019-09-19 NOTE — Telephone Encounter (Signed)
Pt is on schedule for tomorrow, I do not see uptake and scan. Do you know where she did this?

## 2019-09-19 NOTE — Telephone Encounter (Signed)
Called Sovah Bonneau and had to leave a message requesting a return call.

## 2019-09-19 NOTE — Telephone Encounter (Signed)
Patient has not heard from [facility] for scan. She canceled for tomorrow with [place] .. can you call them and refax order

## 2019-09-19 NOTE — Telephone Encounter (Signed)
Called and left a message requesting a return call to the office. Refaxed order.

## 2019-09-20 ENCOUNTER — Ambulatory Visit: Payer: Managed Care, Other (non HMO) | Admitting: "Endocrinology

## 2019-09-20 NOTE — Telephone Encounter (Signed)
Pt has been scheduled.  °

## 2019-09-20 NOTE — Telephone Encounter (Signed)
Margaret Castro faxed back a message stating they are not in patient's network. Discussed with pt she stated she would call her insurance company to verify the closest facility within network.

## 2019-09-20 NOTE — Telephone Encounter (Signed)
Violet is returning your call from [facility] call back @ [phone]

## 2019-09-27 ENCOUNTER — Encounter: Payer: Self-pay | Admitting: "Endocrinology

## 2019-10-03 ENCOUNTER — Ambulatory Visit (INDEPENDENT_AMBULATORY_CARE_PROVIDER_SITE_OTHER): Payer: Managed Care, Other (non HMO) | Admitting: "Endocrinology

## 2019-10-03 ENCOUNTER — Encounter: Payer: Self-pay | Admitting: "Endocrinology

## 2019-10-03 DIAGNOSIS — E0590 Thyrotoxicosis, unspecified without thyrotoxic crisis or storm: Secondary | ICD-10-CM | POA: Diagnosis not present

## 2019-10-03 MED ORDER — METHIMAZOLE 10 MG PO TABS: 10.0000 mg | ORAL_TABLET | Freq: Every day | ORAL | 3 refills | Status: DC

## 2019-10-03 NOTE — Progress Notes (Signed)
10/03/2019, 12:11 PM                                                               Endocrinology Telehealth Visit Follow up Note -During COVID -19 Pandemic  I connected with Margaret Castro on 10/03/2019   by telephone and verified that I am speaking with the correct person using two identifiers. Margaret Castro, 26-May-1956. she has verbally consented to this visit. All issues noted in this document were discussed and addressed. The format was not optimal for physical exam.   Subjective:    Patient ID: Margaret Castro, female    DOB: Dec 19, 1955, PCP Margaret Monte, NP   Past Medical History:  Diagnosis Date  . Hyperlipidemia   . Hypertension    History reviewed. No pertinent surgical history. Social History   Socioeconomic History  . Marital status: Single    Spouse name: Not on file  . Number of children: Not on file  . Years of education: Not on file  . Highest education level: Not on file  Occupational History  . Not on file  Tobacco Use  . Smoking status: Never Smoker  . Smokeless tobacco: Never Used  Substance and Sexual Activity  . Alcohol use: Not Currently  . Drug use: Never  . Sexual activity: Not on file  Other Topics Concern  . Not on file  Social History Narrative  . Not on file   Social Determinants of Health   Financial Resource Strain:   . Difficulty of Paying Living Expenses: Not on file  Food Insecurity:   . Worried About Programme researcher, broadcasting/film/video in the Last Year: Not on file  . Ran Out of Food in the Last Year: Not on file  Transportation Needs:   . Lack of Transportation (Medical): Not on file  . Lack of Transportation (Non-Medical): Not on file  Physical Activity:   . Days of Exercise per Week: Not on file  . Minutes of Exercise per Session: Not on file  Stress:   . Feeling of Stress : Not on file  Social Connections:   . Frequency of Communication with Friends and Family: Not on file  .  Frequency of Social Gatherings with Friends and Family: Not on file  . Attends Religious Services: Not on file  . Active Member of Clubs or Organizations: Not on file  . Attends Banker Meetings: Not on file  . Marital Status: Not on file   Outpatient Encounter Medications as of 10/03/2019  Medication Sig  . lisinopril-hydrochlorothiazide (PRINZIDE,ZESTORETIC) 10-12.5 MG tablet daily.  . methimazole (TAPAZOLE) 10 MG tablet Take 1 tablet (10 mg total) by mouth daily.  . metoprolol succinate (TOPROL-XL) 25 MG 24 hr tablet Take 25 mg by mouth daily after breakfast.  . rosuvastatin (CRESTOR) 20 MG tablet 10 mg daily.   No facility-administered encounter medications on file as of 10/03/2019.   ALLERGIES: No Known Allergies  VACCINATION STATUS:  There is no immunization history on file  for this patient.  HPI Margaret Castro is 64 y.o. female who presents today with a medical history as above. she is being engaged in telehealth for follow-up of hyperthyroidism.  She was previously treated with methimazole to euthyroid state.  Recently, she started to have palpitations, hot flashes, tremors which prompted her to go to her PCP.  She underwent thyroid function test on September 01, 2019 which showed suppressed TSH of less than 0.005.  Her subsequent repeat thyroid function tests on September 06, 2019 showed significant thyroid hormone burden with free T3 of 10.1, total T4 14.7, free T4 3.29, TSH still  Suppressed <0.005. Her thyroid uptake and scan performed in [facility] health care showed significant discrepancy at 23% normal uptake in 24 hours.   -She has 2 sisters with thyroid problems, 1 of them status post thyroidectomy on thyroid hormone replacement.  Another sister with treatment which appears to be radioactive iodine ablation.  She denies family history of thyroid cancer. -She denies any exposure to neck radiation.   -She denies heat/cold intolerance, dysphagia, shortness  of breath, change in her voice.   Review of Systems -Limited as above.    Objective:    There were no vitals taken for this visit.  Wt Readings from Last 3 Encounters:  09/26/18 218 lb 12.8 oz (99.2 kg)  07/25/18 215 lb (97.5 kg)  03/23/18 221 lb (100.2 kg)    Physical Exam  Recent Results (from the past 2160 hour(s))  TSH     Status: Abnormal   Collection Time: 09/01/19 12:00 AM  Result Value Ref Range   TSH 0.01 (A) 0.41 - 6.96  Basic metabolic panel     Status: None   Collection Time: 09/06/19 12:00 AM  Result Value Ref Range   BUN 14 4 - 21   Creatinine 0.8 0.5 - 1.1  Hemoglobin A1c     Status: None   Collection Time: 09/06/19 12:00 AM  Result Value Ref Range   Hemoglobin A1C 5.1   TSH     Status: Abnormal   Collection Time: 09/06/19 12:00 AM  Result Value Ref Range   TSH 0.01 (A) 0.41 - 5.90    Comment: free t3 10.1, ft4 3.29,      March 03, 2018 labs showed normal CBC, free T4 1.2.  Assessment & Plan:   1.  Hyperthyroidism- relapsing  She is previously treated with methimazole for hyperthyroidism until she achieves a euthyroid state.  Her most recent work-up is of hyperthyroidism with significant thyroid hormone burden in the blood test, however her thyroid uptake.  And scan is not confirming significant primary hyperthyroidism.  24-hour iodine 123 uptake was uniform and only 23%-within normal limits.    In light of this uptake, she will not be given ablative treatment for now.  She will however benefit from early initiation of methimazole treatment.  I discussed and prescribed methimazole 10 mg p.o. daily at breakfast with plan to repeat thyroid function test in 8 weeks.    She is currently on metoprolol 25 mg XL p.o. daily, advised to continue.  -Her previous thyroid uptake and scan is consistent with uniform uptake of 27.4% with no focal abnormalities.  She did not require ablative treatment during that time.   - I advised patient to maintain close  follow up with Margaret Castro Margaret Lea, NP for primary care needs.      - Time spent on this patient care encounter:  25 minutes of which 50% was spent  in  counseling and the rest reviewing  her current and  previous labs / studies and medications  doses and developing a plan for long term care. Margaret Castro  participated in the discussions, expressed understanding, and voiced agreement with the above plans.  All questions were answered to her satisfaction. she is encouraged to contact clinic should she have any questions or concerns prior to her return visit.   Follow up plan: Return in about 9 weeks (around 12/05/2019) for Follow up with Pre-visit Labs.   Marquis Lunch, MD [facility] [address] [place], [place] [address] Phone: [phone]  Fax: [phone]     10/03/2019, 12:11 PM  This note was partially dictated with voice recognition software. Similar sounding words can be transcribed inadequately or may not  be corrected upon review.

## 2019-11-28 LAB — TSH: TSH: 0.02 — AB (ref 0.41–5.90)

## 2019-12-05 ENCOUNTER — Ambulatory Visit (INDEPENDENT_AMBULATORY_CARE_PROVIDER_SITE_OTHER): Payer: Managed Care, Other (non HMO) | Admitting: "Endocrinology

## 2019-12-05 ENCOUNTER — Encounter: Payer: Self-pay | Admitting: "Endocrinology

## 2019-12-05 ENCOUNTER — Other Ambulatory Visit: Payer: Self-pay

## 2019-12-05 VITALS — BP 137/79 | HR 73 | Ht 69.5 in | Wt 233.2 lb

## 2019-12-05 DIAGNOSIS — Z789 Other specified health status: Secondary | ICD-10-CM | POA: Diagnosis not present

## 2019-12-05 DIAGNOSIS — E0590 Thyrotoxicosis, unspecified without thyrotoxic crisis or storm: Secondary | ICD-10-CM | POA: Diagnosis not present

## 2019-12-05 MED ORDER — METHIMAZOLE 5 MG PO TABS: 5.0000 mg | ORAL_TABLET | Freq: Every day | ORAL | 1 refills | Status: DC

## 2019-12-05 NOTE — Patient Instructions (Signed)

## 2019-12-05 NOTE — Progress Notes (Signed)
12/05/2019, 12:20 PM                                                   Endocrinology follow-up note    Subjective:    Patient ID: Margaret Castro, female    DOB: [birth year], PCP Renne Crigler, NP   Past Medical History:  Diagnosis Date  . Hyperlipidemia   . Hypertension    History reviewed. No pertinent surgical history. Social History   Socioeconomic History  . Marital status: Single    Spouse name: Not on file  . Number of children: Not on file  . Years of education: Not on file  . Highest education level: Not on file  Occupational History  . Not on file  Tobacco Use  . Smoking status: Never Smoker  . Smokeless tobacco: Never Used  Substance and Sexual Activity  . Alcohol use: Not Currently  . Drug use: Never  . Sexual activity: Not on file  Other Topics Concern  . Not on file  Social History Narrative  . Not on file   Social Determinants of Health   Financial Resource Strain:   . Difficulty of Paying Living Expenses:   Food Insecurity:   . Worried About Charity fundraiser in the Last Year:   . Arboriculturist in the Last Year:   Transportation Needs:   . Film/video editor (Medical):   Marland Kitchen Lack of Transportation (Non-Medical):   Physical Activity:   . Days of Exercise per Week:   . Minutes of Exercise per Session:   Stress:   . Feeling of Stress :   Social Connections:   . Frequency of Communication with Friends and Family:   . Frequency of Social Gatherings with Friends and Family:   . Attends Religious Services:   . Active Member of Clubs or Organizations:   . Attends Archivist Meetings:   Marland Kitchen Marital Status:    Outpatient Encounter Medications as of 12/05/2019  Medication Sig  . lisinopril-hydrochlorothiazide (PRINZIDE,ZESTORETIC) 10-12.5 MG tablet daily.  . methimazole (TAPAZOLE) 5 MG tablet Take 1 tablet (5 mg total) by mouth daily.  . metoprolol succinate (TOPROL-XL) 25 MG  24 hr tablet Take 25 mg by mouth daily after breakfast.  . rosuvastatin (CRESTOR) 20 MG tablet 10 mg daily.  . [DISCONTINUED] methimazole (TAPAZOLE) 10 MG tablet Take 1 tablet (10 mg total) by mouth daily.   No facility-administered encounter medications on file as of 12/05/2019.   ALLERGIES: No Known Allergies  VACCINATION STATUS:  There is no immunization history on file for this patient.  HPI Margaret Castro is 64 y.o. female who presents today with a medical history as above. she is being seen in  follow-up of hyperthyroidism.  She was recently initiated on methimazole 10 mg p.o. daily for recurrent mild hyperthyroidism.  She returns with clinical improvement of her symptoms, and her labs evident of treatment effect.  She did have significant thyroid hormone burden based on labs. Her pretreatment thyroid uptake and scan performed in [facility] care  showed significant discrepancy at 23% normal uptake in 24 hours. She has gained 15 pounds, and she is concerned about it.  She denies palpitations, tremors, heat intolerance.  -She has 2 sisters with thyroid problems, 1 of them status post thyroidectomy on thyroid hormone replacement.  Another sister with treatment which appears to be radioactive iodine ablation.  She denies family history of thyroid cancer. -She denies any exposure to neck radiation.   -She denies  dysphagia, shortness of breath, change in her voice.   Review of Systems -Limited as above.    Objective:    BP 137/79   Pulse 73   Ht 5' 9.5" (1.765 m)   Wt 233 lb 3.2 oz (105.8 kg)   BMI 33.94 kg/m   Wt Readings from Last 3 Encounters:  12/05/19 233 lb 3.2 oz (105.8 kg)  09/26/18 218 lb 12.8 oz (99.2 kg)  07/25/18 215 lb (97.5 kg)    Physical Exam    Recent Results (from the past 2160 hour(s))  TSH     Status: Abnormal   Collection Time: 11/28/19 12:00 AM  Result Value Ref Range   TSH 0.02 (A) 0.41 - 5.90    Comment: ft4 1.02, ft3 2.8      Assessment & Plan:   1.  Hyperthyroidism-recurrent   2.  Weight management  She has responded to resumption of methimazole therapy.  Based on her relatively low uptake of 23% in 24 hours, she was not offered ablative treatment with I-131.   -Her thyroid hormone levels are back to normal, TSH still already.  She is advised to lower her methimazole to 5 mg p.o. daily at breakfast.  She will continue to benefit from low-dose metoprolol 25 mg XL p.o. daily at breakfast.   She will return in 3 months with repeat thyroid function tests for evaluation.  Regarding her weight concern: - she  admits there is a room for improvement in her diet and drink choices. -  Suggestion is made for her to avoid simple carbohydrates  from her diet including Cakes, Sweet Desserts / Pastries, Ice Cream, Soda (diet and regular), Sweet Tea, Candies, Chips, Cookies, Sweet Pastries,  Store Bought Juices, Alcohol in Excess of  1-2 drinks a day, Artificial Sweeteners, Coffee Creamer, and "Sugar-free" Products. This will help patient to potentially avoid unintended weight gain.  - I advised patient to maintain close follow up with [name], NP for primary care needs.     - Time spent on this patient care encounter:  25 minutes of which 50% was spent in  counseling and the rest reviewing  her current and  previous labs / studies and medications  doses and developing a plan for long term care. [name]  participated in the discussions, expressed understanding, and voiced agreement with the above plans.  All questions were answered to her satisfaction. she is encouraged to contact clinic should she have any questions or concerns prior to her return visit.   Follow up plan: Return in about 3 months (around 03/06/2020) for F/U with Pre-visit Labs.   Marquis Lunch, MD [facility] [address] [place], [place] [address] Phone: [phone]  Fax:  [phone]     12/05/2019, 12:20 PM  This note was partially dictated with voice recognition software. Similar sounding words can be transcribed inadequately or may not  be corrected upon review.

## 2020-01-30 ENCOUNTER — Telehealth: Payer: Self-pay | Admitting: "Endocrinology

## 2020-01-30 NOTE — Telephone Encounter (Signed)
Pt requesting refill on methimazole (TAPAZOLE) 5 MG tablet - 90 day supply and uses [facility]

## 2020-02-02 MED ORDER — METHIMAZOLE 5 MG PO TABS: 5.0000 mg | ORAL_TABLET | Freq: Every day | ORAL | 0 refills | Status: DC

## 2020-02-02 NOTE — Telephone Encounter (Signed)
Rx refill sent to [facility]. 

## 2020-03-02 LAB — TSH: TSH: 0.01 — AB (ref 0.41–5.90)

## 2020-03-08 ENCOUNTER — Telehealth (INDEPENDENT_AMBULATORY_CARE_PROVIDER_SITE_OTHER): Payer: Managed Care, Other (non HMO) | Admitting: "Endocrinology

## 2020-03-08 ENCOUNTER — Other Ambulatory Visit: Payer: Self-pay

## 2020-03-08 ENCOUNTER — Encounter: Payer: Self-pay | Admitting: "Endocrinology

## 2020-03-08 VITALS — Ht 69.5 in

## 2020-03-08 DIAGNOSIS — E0590 Thyrotoxicosis, unspecified without thyrotoxic crisis or storm: Secondary | ICD-10-CM | POA: Diagnosis not present

## 2020-03-08 MED ORDER — METHIMAZOLE 5 MG PO TABS: 5.0000 mg | ORAL_TABLET | Freq: Two times a day (BID) | ORAL | 1 refills | Status: DC

## 2020-03-08 NOTE — Progress Notes (Signed)
03/08/2020, 10:35 AM                                          Endocrinology follow-up note   Subjective:    Patient ID: Margaret Castro, female    DOB: [birth year], PCP Verlee Monte, NP   Past Medical History:  Diagnosis Date  . Hyperlipidemia   . Hypertension    History reviewed. No pertinent surgical history. Social History   Socioeconomic History  . Marital status: Single    Spouse name: Not on file  . Number of children: Not on file  . Years of education: Not on file  . Highest education level: Not on file  Occupational History  . Not on file  Tobacco Use  . Smoking status: Never Smoker  . Smokeless tobacco: Never Used  Vaping Use  . Vaping Use: Never used  Substance and Sexual Activity  . Alcohol use: Not Currently  . Drug use: Never  . Sexual activity: Not on file  Other Topics Concern  . Not on file  Social History Narrative  . Not on file   Social Determinants of Health   Financial Resource Strain:   . Difficulty of Paying Living Expenses:   Food Insecurity:   . Worried About Programme researcher, broadcasting/film/video in the Last Year:   . Barista in the Last Year:   Transportation Needs:   . Freight forwarder (Medical):   Marland Kitchen Lack of Transportation (Non-Medical):   Physical Activity:   . Days of Exercise per Week:   . Minutes of Exercise per Session:   Stress:   . Feeling of Stress :   Social Connections:   . Frequency of Communication with Friends and Family:   . Frequency of Social Gatherings with Friends and Family:   . Attends Religious Services:   . Active Member of Clubs or Organizations:   . Attends Banker Meetings:   Marland Kitchen Marital Status:    Outpatient Encounter Medications as of 03/08/2020  Medication Sig  . lisinopril-hydrochlorothiazide (PRINZIDE,ZESTORETIC) 10-12.5 MG tablet daily.  . methimazole (TAPAZOLE) 5 MG tablet Take 1 tablet (5 mg total) by mouth 2 (two) times daily.   . metoprolol succinate (TOPROL-XL) 25 MG 24 hr tablet Take 25 mg by mouth daily after breakfast.  . [DISCONTINUED] methimazole (TAPAZOLE) 5 MG tablet Take 1 tablet (5 mg total) by mouth daily.  . [DISCONTINUED] rosuvastatin (CRESTOR) 20 MG tablet 10 mg daily.   No facility-administered encounter medications on file as of 03/08/2020.   ALLERGIES: No Known Allergies  VACCINATION STATUS:  There is no immunization history on file for this patient.  HPI Margaret Castro is 64 y.o. female who presents today with a medical history as above. she is being seen in  follow-up of hyperthyroidism.  She is currently on methimazole 5 mg p.o. daily, metoprolol 25 mg p.o. daily. 1212 work-up with minimal uptake of 23% in 24 hours, she was not offered ablative treatment.   She has no new complaints today.  Her previsit labs show improving free T4 to  1.66,  however still suppressed TSH of 0.007.  In the same lab free T3  Was 4.2.  Her pretreatment thyroid uptake and scan performed in [facility] health care showed significant discrepancy at 23% normal uptake in 24 hours. She reports similar weight as part of her last visit.  She denies palpitations, tremors, heat intolerance.    -She has 2 sisters with thyroid problems, 1 of them status post thyroidectomy on thyroid hormone replacement.  Another sister with treatment which appears to be radioactive iodine ablation.  She denies family history of thyroid cancer. -She denies any exposure to neck radiation.   -She denies  dysphagia, shortness of breath, change in her voice.   Review of Systems -Limited as above.    Objective:    Ht 5' 9.5" (1.765 m)   BMI 33.94 kg/m   Wt Readings from Last 3 Encounters:  12/05/19 233 lb 3.2 oz (105.8 kg)  09/26/18 218 lb 12.8 oz (99.2 kg)  07/25/18 215 lb (97.5 kg)    Physical Exam    Recent Results (from the past 2160 hour(s))  TSH     Status: Abnormal   Collection Time: 03/02/20 12:00 AM  Result  Value Ref Range   TSH 0.01 (A) 0.41 - 5.90    Comment: Free T4-1.66, Free T3- 4.2     Assessment & Plan:   1.  Hyperthyroidism-recurrent   2.  Weight management  She has responded to resumption of methimazole therapy.  Based on her relatively low uptake of 23% in 24 hours, she was not offered ablative treatment with I-131.   -Her thyroid hormone levels are back to normal, TSH still low at 0.007.   She is advised to increase methimazole to 5 mg p.o. twice daily with breakfast and supper.   She will continue to benefit from low-dose metoprolol 25 mg XL p.o. daily at breakfast.   She will return in 3 months with repeat thyroid function tests for evaluation.  Regarding her weight concern:  - she  admits there is a room for improvement in her diet and drink choices. -  Suggestion is made for her to avoid simple carbohydrates  from her diet including Cakes, Sweet Desserts / Pastries, Ice Cream, Soda (diet and regular), Sweet Tea, Candies, Chips, Cookies, Sweet Pastries,  Store Bought Juices, Alcohol in Excess of  1-2 drinks a day, Artificial Sweeteners, Coffee Creamer, and "Sugar-free" Products. This will help patient to have stable blood glucose profile and potentially avoid unintended weight gain.   - I advised patient to maintain close follow up with [name], NP for primary care needs.    - Time spent on this patient care encounter:  20 minutes of which 50% was spent in  counseling and the rest reviewing  her current and  previous labs / studies and medications  doses and developing a plan for long term care. [name]  participated in the discussions, expressed understanding, and voiced agreement with the above plans.  All questions were answered to her satisfaction. she is encouraged to contact clinic should she have any questions or concerns prior to her return visit.   Follow up plan: Return in about 3 months (around 06/08/2020) for F/U with Pre-visit Labs.   Marquis Lunch, MD [facility] [address] [place], [place] [address] Phone: [phone]  Fax: [phone]     03/08/2020, 10:35 AM  This note was partially dictated with voice recognition software. Similar sounding words  can be transcribed inadequately or may not  be corrected upon review.

## 2020-03-13 ENCOUNTER — Telehealth: Payer: Self-pay | Admitting: "Endocrinology

## 2020-03-13 DIAGNOSIS — E0590 Thyrotoxicosis, unspecified without thyrotoxic crisis or storm: Secondary | ICD-10-CM

## 2020-03-13 MED ORDER — METHIMAZOLE 5 MG PO TABS: 5.0000 mg | ORAL_TABLET | Freq: Two times a day (BID) | ORAL | 1 refills | Status: DC

## 2020-03-13 NOTE — Telephone Encounter (Signed)
Sent rx in

## 2020-03-13 NOTE — Telephone Encounter (Signed)
Patients methimazole (TAPAZOLE) 5 MG tablet says fill later and pharmacy does not have it. Can you send?

## 2020-03-18 MED ORDER — METHIMAZOLE 5 MG PO TABS: 5.0000 mg | ORAL_TABLET | Freq: Two times a day (BID) | ORAL | 1 refills | Status: DC

## 2020-03-18 NOTE — Telephone Encounter (Signed)
This was suppose to go to [facility]. Please re send.

## 2020-03-18 NOTE — Addendum Note (Signed)
Addended by: Derrell Lolling on: 03/18/2020 10:38 AM   Modules accepted: Orders

## 2020-03-18 NOTE — Telephone Encounter (Signed)
Rx refill sent to [facility].

## 2020-03-19 ENCOUNTER — Other Ambulatory Visit: Payer: Self-pay

## 2020-03-19 DIAGNOSIS — E0590 Thyrotoxicosis, unspecified without thyrotoxic crisis or storm: Secondary | ICD-10-CM

## 2020-03-19 MED ORDER — METHIMAZOLE 5 MG PO TABS: 5.0000 mg | ORAL_TABLET | Freq: Two times a day (BID) | ORAL | 1 refills | Status: DC

## 2020-06-04 LAB — TSH: TSH: 33.94 — AB (ref 0.41–5.90)

## 2020-06-07 ENCOUNTER — Telehealth (INDEPENDENT_AMBULATORY_CARE_PROVIDER_SITE_OTHER): Payer: Managed Care, Other (non HMO) | Admitting: "Endocrinology

## 2020-06-07 ENCOUNTER — Encounter: Payer: Self-pay | Admitting: "Endocrinology

## 2020-06-07 DIAGNOSIS — E0590 Thyrotoxicosis, unspecified without thyrotoxic crisis or storm: Secondary | ICD-10-CM

## 2020-06-07 NOTE — Progress Notes (Signed)
06/07/2020, 12:51 PM                                                                  Endocrinology Telehealth Visit Follow up Note -During COVID -19 Pandemic  This visit type was conducted  via telephone due to national recommendations for restrictions regarding the COVID-19 Pandemic  in an effort to limit this patient's exposure and mitigate transmission of the corona virus.   I connected with [name] on 06/07/2020   by telephone and verified that I am speaking with the correct person using two identifiers. Margaret Castro, September 22, 1955. she has verbally consented to this visit.  I was in my office and patient was in her residence. No other persons were with me during the encounter. All issues noted in this document were discussed and addressed. The format was not optimal for physical exam.    Subjective:    Patient ID: Margaret Castro, female    DOB: 15-Dec-1955, PCP Verlee Monte, NP   Past Medical History:  Diagnosis Date  . Hyperlipidemia   . Hypertension    History reviewed. No pertinent surgical history. Social History   Socioeconomic History  . Marital status: Single    Spouse name: Not on file  . Number of children: Not on file  . Years of education: Not on file  . Highest education level: Not on file  Occupational History  . Not on file  Tobacco Use  . Smoking status: Never Smoker  . Smokeless tobacco: Never Used  Vaping Use  . Vaping Use: Never used  Substance and Sexual Activity  . Alcohol use: Not Currently  . Drug use: Never  . Sexual activity: Not on file  Other Topics Concern  . Not on file  Social History Narrative  . Not on file   Social Determinants of Health   Financial Resource Strain:   . Difficulty of Paying Living Expenses: Not on file  Food Insecurity:   . Worried About Programme researcher, broadcasting/film/video in the Last Year: Not on file  . Ran Out of Food in the Last Year: Not on file   Transportation Needs:   . Lack of Transportation (Medical): Not on file  . Lack of Transportation (Non-Medical): Not on file  Physical Activity:   . Days of Exercise per Week: Not on file  . Minutes of Exercise per Session: Not on file  Stress:   . Feeling of Stress : Not on file  Social Connections:   . Frequency of Communication with Friends and Family: Not on file  . Frequency of Social Gatherings with Friends and Family: Not on file  . Attends Religious Services: Not on file  . Active Member of Clubs or Organizations: Not on file  . Attends Banker Meetings: Not on file  . Marital Status: Not on file   Outpatient Encounter Medications as of 06/07/2020  Medication Sig  . lisinopril-hydrochlorothiazide (PRINZIDE,ZESTORETIC) 10-12.5 MG tablet daily.  . [  DISCONTINUED] methimazole (TAPAZOLE) 5 MG tablet Take 1 tablet (5 mg total) by mouth 2 (two) times daily.  . [DISCONTINUED] metoprolol succinate (TOPROL-XL) 25 MG 24 hr tablet Take 25 mg by mouth daily after breakfast.   No facility-administered encounter medications on file as of 06/07/2020.   ALLERGIES: No Known Allergies  VACCINATION STATUS:  There is no immunization history on file for this patient.  HPI Margaret Castro is 64 y.o. female who presents today with a medical history as above. she is being engaged in telehealth via telephone in follow-up for her hypothyroidism.  Her previsit labs show iatrogenic hypothyroidism.  She was kept on methimazole 5 mg p.o. twice daily during her last visit.  She reports clinical improvement in her previous symptoms of palpitations, tremor, heat intolerance.  She also reports bradycardia based on her exercise watch.  .    Her pretreatment thyroid uptake and scan performed in [facility] health care showed significant discrepancy at 23% normal uptake in 24 hours. She has gained 15 pounds, and she is concerned about it.  She denies palpitations, tremors, heat  intolerance.  -She has 2 sisters with thyroid problems, 1 of them status post thyroidectomy on thyroid hormone replacement.  Another sister with treatment which appears to be radioactive iodine ablation.  She denies family history of thyroid cancer. -She denies any exposure to neck radiation.   -She denies  dysphagia, shortness of breath, change in her voice.   Review of Systems -Limited as above.    Objective:    There were no vitals taken for this visit.  Wt Readings from Last 3 Encounters:  12/05/19 233 lb 3.2 oz (105.8 kg)  09/26/18 218 lb 12.8 oz (99.2 kg)  07/25/18 215 lb (97.5 kg)    Physical Exam    Recent Results (from the past 2160 hour(s))  TSH     Status: Abnormal   Collection Time: 06/04/20 12:00 AM  Result Value Ref Range   TSH 33.94 (A) 0.41 - 5.90    Comment: FREE T4- 0.59, T3- 2.0     Assessment & Plan:   1.  Hyperthyroidism-recurrent    2 .  Iatrogenic hypothyroidism   She has responded to resumption of methimazole therapy.  Based on her relatively low uptake of 23% in 24 hours, she was not offered ablative treatment with I-131.   -Her thyroid hormone levels are below normal at this time.  She is advised to discontinue methimazole and metoprolol at this time.  She will have repeat thyroid function test in 3 months.   -If she has relapse of hyperthyroidism, she will be considered for I-131 thyroid ablation preceded by thyroid uptake and scan. Regarding her weight concern: - she  admits there is a room for improvement in her diet and drink choices. -  Suggestion is made for her to avoid simple carbohydrates  from her diet including Cakes, Sweet Desserts / Pastries, Ice Cream, Soda (diet and regular), Sweet Tea, Candies, Chips, Cookies, Sweet Pastries,  Store Bought Juices, Alcohol in Excess of  1-2 drinks a day, Artificial Sweeteners, Coffee Creamer, and "Sugar-free" Products. This will help patient to potentially avoid unintended weight gain.  - I advised  patient to maintain close follow up with [name], NP for primary care needs.     - Time spent on this patient care encounter:  20 minutes of which 50% was spent in  counseling and the rest reviewing  her current and  previous labs / studies  and medications  doses and developing a plan for long term care. Margaret Castro  participated in the discussions, expressed understanding, and voiced agreement with the above plans.  All questions were answered to her satisfaction. she is encouraged to contact clinic should she have any questions or concerns prior to her return visit.    Follow up plan: Return in about 3 months (around 09/07/2020), or cancel her appt next week., for F/U with Pre-visit Labs.   Marquis Lunch, MD [facility] [address] [place], [place] [address] Phone: [phone]  Fax: [phone]     06/07/2020, 12:51 PM  This note was partially dictated with voice recognition software. Similar sounding words can be transcribed inadequately or may not  be corrected upon review.

## 2020-06-11 ENCOUNTER — Ambulatory Visit: Payer: Managed Care, Other (non HMO) | Admitting: "Endocrinology

## 2020-09-03 LAB — TSH: TSH: 0.01 — AB (ref 0.41–5.90)

## 2020-09-09 ENCOUNTER — Other Ambulatory Visit: Payer: Self-pay

## 2020-09-09 ENCOUNTER — Ambulatory Visit (INDEPENDENT_AMBULATORY_CARE_PROVIDER_SITE_OTHER): Payer: Managed Care, Other (non HMO) | Admitting: "Endocrinology

## 2020-09-09 ENCOUNTER — Encounter: Payer: Self-pay | Admitting: "Endocrinology

## 2020-09-09 VITALS — BP 150/78 | HR 96 | Ht 69.5 in | Wt 211.2 lb

## 2020-09-09 DIAGNOSIS — E0590 Thyrotoxicosis, unspecified without thyrotoxic crisis or storm: Secondary | ICD-10-CM | POA: Diagnosis not present

## 2020-09-09 MED ORDER — METOPROLOL SUCCINATE ER 25 MG PO TB24
25.0000 mg | ORAL_TABLET | Freq: Every day | ORAL | 1 refills | Status: DC | PRN
Start: 2020-09-09 — End: 2020-11-22

## 2020-09-09 MED ORDER — PREDNISONE 5 MG PO TABS: 5.0000 mg | ORAL_TABLET | Freq: Every day | ORAL | 0 refills | Status: DC

## 2020-09-09 NOTE — Progress Notes (Signed)
09/09/2020, 2:54 PM                                      Endocrinology follow-up note   Subjective:    Patient ID: Margaret Castro, female    DOB: [birth year], PCP Verlee Monte, NP   Past Medical History:  Diagnosis Date  . Hyperlipidemia   . Hypertension    History reviewed. No pertinent surgical history. Social History   Socioeconomic History  . Marital status: Single    Spouse name: Not on file  . Number of children: Not on file  . Years of education: Not on file  . Highest education level: Not on file  Occupational History  . Not on file  Tobacco Use  . Smoking status: Never Smoker  . Smokeless tobacco: Never Used  Vaping Use  . Vaping Use: Never used  Substance and Sexual Activity  . Alcohol use: Not Currently  . Drug use: Never  . Sexual activity: Not on file  Other Topics Concern  . Not on file  Social History Narrative  . Not on file   Social Determinants of Health   Financial Resource Strain: Not on file  Food Insecurity: Not on file  Transportation Needs: Not on file  Physical Activity: Not on file  Stress: Not on file  Social Connections: Not on file   Outpatient Encounter Medications as of 09/09/2020  Medication Sig  . predniSONE (DELTASONE) 5 MG tablet Take 1 tablet (5 mg total) by mouth daily with breakfast.  . lisinopril (ZESTRIL) 10 MG tablet Take 10 mg by mouth daily.  . metoprolol succinate (TOPROL-XL) 25 MG 24 hr tablet Take 1 tablet (25 mg total) by mouth daily as needed.  Marland Kitchen omeprazole (PRILOSEC) 40 MG capsule Take 40 mg by mouth daily.  . [DISCONTINUED] lisinopril-hydrochlorothiazide (PRINZIDE,ZESTORETIC) 10-12.5 MG tablet daily.  . [DISCONTINUED] metoprolol succinate (TOPROL-XL) 25 MG 24 hr tablet Take 1 tablet by mouth daily as needed.   No facility-administered encounter medications on file as of 09/09/2020.   ALLERGIES: No Known Allergies  VACCINATION STATUS:  There is no  immunization history on file for this patient.  HPI Margaret Castro is 65 y.o. female who presents today for follow-up after she was seen on 7 encounters for recurrent hyperthyroidism.  Due to iatrogenic hypothyroidism, she was taken off on low-dose methimazole during her last visit.  She returns with interval weight loss, palpitations, tremors.  She also reports heat intolerance.  She is only on low-dose beta-blocker at this time.    Her previsit thyroid function tests are consistent with relapse.  Previously an outside facility thyroid uptake and scan performed in [facility] health care showed significant discrepancy at 23% normal uptake in 24 hours.   -She has 2 sisters with thyroid problems, 1 of them status post thyroidectomy on thyroid hormone replacement.  Another sister with treatment which appears to be radioactive iodine ablation.  She denies family history of thyroid cancer. -She denies any exposure to neck radiation.   -She denies  dysphagia, shortness of breath, change in her voice.   Review of Systems -Limited as  above.    Objective:    BP (!) 150/78   Pulse 96   Ht 5' 9.5" (1.765 m)   Wt 211 lb 3.2 oz (95.8 kg)   BMI 30.74 kg/m   Wt Readings from Last 3 Encounters:  09/09/20 211 lb 3.2 oz (95.8 kg)  12/05/19 233 lb 3.2 oz (105.8 kg)  09/26/18 218 lb 12.8 oz (99.2 kg)    Physical Exam    Recent Results (from the past 2160 hour(s))  TSH     Status: Abnormal   Collection Time: 09/03/20 12:00 AM  Result Value Ref Range   TSH 0.01 (A) 0.41 - 5.90    Comment: FREE T4- 2.35, FREE T3- 7.3     Assessment & Plan:   1.  Hyperthyroidism-recurrent     She is presenting with recurrent primary hyperthyroidism, previously partially responding to methimazole treatment.  She will likely need ablative treatment with I-131.  Since her last uptake and scan was in March 2021, she will need repeat thyroid uptake and scan to clear way for treatment.   I had a long  discussion with her about the sequence of events.  She would benefit from continued treatment with low-dose beta-blocker involving Toprol 25 mg p.o. daily at breakfast.  She would also benefit from low-dose prednisone as preparation for exposure to I-131.  I discussed and prescribed prednisone 5 mg p.o. daily at breakfast.  She is accepting prednisone prescription hesitantly..  - I advised patient to maintain close follow up with Verlee Monte, NP for primary care needs.     - Time spent on this patient care encounter:  30 minutes of which 50% was spent in  counseling and the rest reviewing  her current and  previous labs / studies and medications  doses and developing a plan for long term care, and documenting this care. [name]  participated in the discussions, expressed understanding, and voiced agreement with the above plans.  All questions were answered to her satisfaction. she is encouraged to contact clinic should she have any questions or concerns prior to her return visit.   Follow up plan: Return in about 10 days (around 09/19/2020) for F/U with Thyroid Uptake and Scan.   Marquis Lunch, MD [facility] [address] [place], [place] [address] Phone: [phone]  Fax: [phone]     09/09/2020, 2:54 PM  This note was partially dictated with voice recognition software. Similar sounding words can be transcribed inadequately or may not  be corrected upon review.

## 2020-09-16 ENCOUNTER — Encounter (HOSPITAL_COMMUNITY): Payer: Self-pay

## 2020-09-16 ENCOUNTER — Other Ambulatory Visit: Payer: Self-pay

## 2020-09-16 ENCOUNTER — Encounter (HOSPITAL_COMMUNITY)
Admission: RE | Admit: 2020-09-16 | Discharge: 2020-09-16 | Disposition: A | Discharge status: Home / Self Care | Payer: Managed Care, Other (non HMO) | Source: Ambulatory Visit | Attending: "Endocrinology | Admitting: "Endocrinology

## 2020-09-16 DIAGNOSIS — E0590 Thyrotoxicosis, unspecified without thyrotoxic crisis or storm: Secondary | ICD-10-CM | POA: Diagnosis not present

## 2020-09-16 MED ORDER — SODIUM IODIDE I-123 7.4 MBQ CAPS
438.0000 | ORAL_CAPSULE | Freq: Once | ORAL | Status: AC
  Administered 2020-09-16: 438 via ORAL

## 2020-09-17 ENCOUNTER — Encounter (HOSPITAL_COMMUNITY)
Admission: RE | Admit: 2020-09-17 | Discharge: 2020-09-17 | Disposition: A | Discharge status: Home / Self Care | Payer: Managed Care, Other (non HMO) | Source: Ambulatory Visit | Attending: "Endocrinology | Admitting: "Endocrinology

## 2020-09-20 ENCOUNTER — Encounter: Payer: Self-pay | Admitting: "Endocrinology

## 2020-09-20 ENCOUNTER — Other Ambulatory Visit: Payer: Self-pay

## 2020-09-20 ENCOUNTER — Ambulatory Visit (INDEPENDENT_AMBULATORY_CARE_PROVIDER_SITE_OTHER): Payer: Managed Care, Other (non HMO) | Admitting: "Endocrinology

## 2020-09-20 VITALS — BP 146/80 | HR 60 | Ht 69.5 in | Wt 213.0 lb

## 2020-09-20 DIAGNOSIS — E0590 Thyrotoxicosis, unspecified without thyrotoxic crisis or storm: Secondary | ICD-10-CM | POA: Diagnosis not present

## 2020-09-20 DIAGNOSIS — E0500 Thyrotoxicosis with diffuse goiter without thyrotoxic crisis or storm: Secondary | ICD-10-CM

## 2020-09-20 NOTE — Progress Notes (Signed)
09/20/2020, 11:14 AM                                     Endocrinology follow-up note   Subjective:    Patient ID: Margaret Castro, female    DOB: 01/09/1956, PCP Verlee Monte, NP   Past Medical History:  Diagnosis Date  . Hyperlipidemia   . Hypertension    History reviewed. No pertinent surgical history. Social History   Socioeconomic History  . Marital status: Single    Spouse name: Not on file  . Number of children: Not on file  . Years of education: Not on file  . Highest education level: Not on file  Occupational History  . Not on file  Tobacco Use  . Smoking status: Never Smoker  . Smokeless tobacco: Never Used  Vaping Use  . Vaping Use: Never used  Substance and Sexual Activity  . Alcohol use: Not Currently  . Drug use: Never  . Sexual activity: Not on file  Other Topics Concern  . Not on file  Social History Narrative  . Not on file   Social Determinants of Health   Financial Resource Strain: Not on file  Food Insecurity: Not on file  Transportation Needs: Not on file  Physical Activity: Not on file  Stress: Not on file  Social Connections: Not on file   Outpatient Encounter Medications as of 09/20/2020  Medication Sig  . lisinopril (ZESTRIL) 10 MG tablet Take 10 mg by mouth daily.  . metoprolol succinate (TOPROL-XL) 25 MG 24 hr tablet Take 1 tablet (25 mg total) by mouth daily as needed.  Marland Kitchen omeprazole (PRILOSEC) 40 MG capsule Take 40 mg by mouth daily.  . predniSONE (DELTASONE) 5 MG tablet Take 1 tablet (5 mg total) by mouth daily with breakfast.   No facility-administered encounter medications on file as of 09/20/2020.   ALLERGIES: No Known Allergies  VACCINATION STATUS:  There is no immunization history on file for this patient.  HPI Margaret Castro is 65 y.o. female who presents today for follow-up after she was seen on previous encounters for recurrent/relapsing hyperthyroidism.   She was sent for thyroid uptake and scan after her last visit.  This scan confirms 52% uniform uptake consistent with Graves' disease on September 09, 2020.  She is on low-dose beta-blocker and prednisone 5 mg p.o. daily.  She has no new complaints today.  She continues to have mild heat intolerance, anxiety.   -She has 2 sisters with thyroid problems, 1 of them status post thyroidectomy on thyroid hormone replacement.  Another sister with treatment which appears to be radioactive iodine ablation.  She denies family history of thyroid cancer. -She denies any exposure to neck radiation.   -She denies  dysphagia, shortness of breath, change in her voice.   Review of Systems -Limited as above.    Objective:    BP (!) 146/80   Pulse 60   Ht 5' 9.5" (1.765 m)   Wt 213 lb (96.6 kg)   BMI 31.00 kg/m   Wt Readings from Last 3 Encounters:  09/20/20 213 lb (96.6 kg)  09/09/20 211  lb 3.2 oz (95.8 kg)  12/05/19 233 lb 3.2 oz (105.8 kg)    Physical Exam    Recent Results (from the past 2160 hour(s))  TSH     Status: Abnormal   Collection Time: 09/03/20 12:00 AM  Result Value Ref Range   TSH 0.01 (A) 0.41 - 5.90    Comment: FREE T4- 2.35, FREE T3- 7.3     Assessment & Plan:   1.  Hyperthyroidism-recurrent     She is presenting with recurrent primary hyperthyroidism, previously partially responding to methimazole treatment.  Her previsit thyroid uptake and scan confirms primary hypothyroidism from Graves' disease.    The need for and options of definitive treatment were discussed again with her.  She would benefit from ablative treatment with I-131.   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  -She agrees with plan and this treatment will be scheduled to be administered in [facility] nuclear medicine.   She will benefit from continued treatment with low-dose  beta-blocker involving Toprol 25 mg p.o. daily at breakfast.  She will also benefit from low-dose prednisone as preparation for exposure to I-131.  She will return in 9 weeks with repeat thyroid function test for assessment of treatment effect and to see if she will be ready for thyroid hormone replacement.     - I advised patient to maintain close follow up with [name], NP for primary care needs.     - Time spent on this patient care encounter:  30 minutes of which 50% was spent in  counseling and the rest reviewing  her current and  previous labs / studies and medications  doses and developing a plan for long term care, and documenting this care. [name]  participated in the discussions, expressed understanding, and voiced agreement with the above plans.  All questions were answered to her satisfaction. she is encouraged to contact clinic should she have any questions or concerns prior to her return visit.   Follow up plan: Return in about 9 weeks (around 11/22/2020) for F/U with Labs after I131 Therapy.   Marquis Lunch, MD [facility] [address] [place], [place] [address] Phone: [phone]  Fax: [phone]     09/20/2020, 11:14 AM  This note was partially dictated with voice recognition software. Similar sounding words can be transcribed inadequately or may not  be corrected upon review.

## 2020-09-27 ENCOUNTER — Telehealth: Payer: Self-pay

## 2020-09-27 NOTE — Telephone Encounter (Signed)
[employer] with [employer] called and said she is approved - it is good for 180 calender days. The reference number is [id number]

## 2020-09-27 NOTE — Telephone Encounter (Signed)
Noted  

## 2020-09-30 ENCOUNTER — Other Ambulatory Visit: Payer: Self-pay

## 2020-09-30 ENCOUNTER — Encounter (HOSPITAL_COMMUNITY)
Admission: RE | Admit: 2020-09-30 | Discharge: 2020-09-30 | Disposition: A | Discharge status: Home / Self Care | Payer: Managed Care, Other (non HMO) | Source: Ambulatory Visit | Attending: "Endocrinology | Admitting: "Endocrinology

## 2020-09-30 DIAGNOSIS — E0590 Thyrotoxicosis, unspecified without thyrotoxic crisis or storm: Secondary | ICD-10-CM | POA: Insufficient documentation

## 2020-09-30 MED ORDER — SODIUM IODIDE I 131 CAPSULE
14.8000 | Freq: Once | INTRAVENOUS | Status: AC | PRN
  Administered 2020-09-30: 14.8 via ORAL

## 2020-11-16 LAB — TSH: TSH: 0.01 — AB (ref 0.41–5.90)

## 2020-11-22 ENCOUNTER — Other Ambulatory Visit: Payer: Self-pay

## 2020-11-22 ENCOUNTER — Encounter: Payer: Self-pay | Admitting: "Endocrinology

## 2020-11-22 ENCOUNTER — Ambulatory Visit (INDEPENDENT_AMBULATORY_CARE_PROVIDER_SITE_OTHER): Payer: Managed Care, Other (non HMO) | Admitting: "Endocrinology

## 2020-11-22 VITALS — BP 123/68 | HR 68 | Ht 69.5 in | Wt 217.6 lb

## 2020-11-22 DIAGNOSIS — E0590 Thyrotoxicosis, unspecified without thyrotoxic crisis or storm: Secondary | ICD-10-CM

## 2020-11-22 DIAGNOSIS — E0500 Thyrotoxicosis with diffuse goiter without thyrotoxic crisis or storm: Secondary | ICD-10-CM | POA: Diagnosis not present

## 2020-11-22 NOTE — Progress Notes (Signed)
11/22/2020, 11:15 AM                                     Endocrinology follow-up note   Subjective:    Patient ID: Margaret Castro, female    DOB: 12/22/1955, PCP Verlee Monte, NP   Past Medical History:  Diagnosis Date  . Hyperlipidemia   . Hypertension    History reviewed. No pertinent surgical history. Social History   Socioeconomic History  . Marital status: Single    Spouse name: Not on file  . Number of children: Not on file  . Years of education: Not on file  . Highest education level: Not on file  Occupational History  . Not on file  Tobacco Use  . Smoking status: Never Smoker  . Smokeless tobacco: Never Used  Vaping Use  . Vaping Use: Never used  Substance and Sexual Activity  . Alcohol use: Not Currently  . Drug use: Never  . Sexual activity: Not on file  Other Topics Concern  . Not on file  Social History Narrative  . Not on file   Social Determinants of Health   Financial Resource Strain: Not on file  Food Insecurity: Not on file  Transportation Needs: Not on file  Physical Activity: Not on file  Stress: Not on file  Social Connections: Not on file   Outpatient Encounter Medications as of 11/22/2020  Medication Sig  . lisinopril (ZESTRIL) 10 MG tablet Take 10 mg by mouth daily.  Marland Kitchen omeprazole (PRILOSEC) 40 MG capsule Take 40 mg by mouth daily.  . [DISCONTINUED] metoprolol succinate (TOPROL-XL) 25 MG 24 hr tablet Take 1 tablet (25 mg total) by mouth daily as needed.  . [DISCONTINUED] predniSONE (DELTASONE) 5 MG tablet Take 1 tablet (5 mg total) by mouth daily with breakfast. (Patient not taking: Reported on 11/22/2020)   No facility-administered encounter medications on file as of 11/22/2020.   ALLERGIES: No Known Allergies  VACCINATION STATUS:  There is no immunization history on file for this patient.  HPI Margaret Castro is 65 y.o. female who presents today for follow-up after she  was seen on previous encounters for recurrent/relapsing hyperthyroidism.  Her subsequent thyroid uptake and scan was confirmatory for Graves' disease with 52% uptake in 24 hours.    -After preparation with low-dose prednisone and beta-blocker, she was given radioactive iodine thyroid ablation which she received on September 30, 2020.  She continued to improve clinically with resolution of her prior symptoms of palpitation, tremor, anxiety. -Her previsit thyroid function tests are consistent with treatment effect, not hypothyroidism yet.   -She has 2 sisters with thyroid problems, 1 of them status post thyroidectomy on thyroid hormone replacement.  Another sister with treatment which appears to be radioactive iodine ablation.  She denies family history of thyroid cancer. -She denies any exposure to neck radiation.   -She denies  dysphagia, shortness of breath, change in her voice.   Review of Systems -Limited as above.    Objective:    BP 123/68   Pulse 68   Ht 5' 9.5" (1.765 m)   Wt 217 lb 9.6 oz (  98.7 kg)   BMI 31.67 kg/m   Wt Readings from Last 3 Encounters:  11/22/20 217 lb 9.6 oz (98.7 kg)  09/20/20 213 lb (96.6 kg)  09/09/20 211 lb 3.2 oz (95.8 kg)    Physical Exam    Recent Results (from the past 2160 hour(s))  TSH     Status: Abnormal   Collection Time: 09/02/20 12:00 AM  Result Value Ref Range   TSH 0.01 (A) 0.41 - 5.90    Comment: FREE T4- 2.35, FREE T3- 7.3  TSH     Status: Abnormal   Collection Time: 11/16/20 12:00 AM  Result Value Ref Range   TSH 0.01 (A) 0.41 - 5.90    Comment: FREE T4-1.40, FREE T3- 2.8     Assessment & Plan:   1.  Hyperthyroidism-recurrent     She is status post RAI thyroid ablation on September 30, 2020.  Her pretreatment work-up confirmed Graves' disease with 52% uptake in 24 hours.  Her previsit labs are consistent with treatment effect.  She did not develop hypothyroidism yet.   She will not be initiated on thyroid hormone until next  measurement in 5 weeks.  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  She has benefited from low-dose steroids and beta-blocker which will be discontinued at this time.   - I advised patient to maintain close follow up with [name], NP for primary care needs.   I spent 25 minutes in the care of the patient today including review of labs from Thyroid Function, thyroid ablation treatments, work-up, and other relevant labs ;face-to-face time discussing  her lab results and symptoms, medications doses, her options of short and long term treatment based on the latest standards of care / guidelines;   and documenting the encounter.  [name]  participated in the discussions, expressed understanding, and voiced agreement with the above plans.  All questions were answered to her satisfaction. she is encouraged to contact clinic should she have any questions or concerns prior to her return visit.  Follow up plan: Return in about 6 weeks (around 01/03/2021) for F/U with Pre-visit Labs.   Marquis Lunch, MD [facility] [address] [place], [place] [address] Phone: [phone]  Fax: [phone]     11/22/2020, 11:15 AM  This note was partially dictated with voice recognition software. Similar sounding words can be transcribed inadequately or may not  be corrected upon review.

## 2021-01-01 LAB — BASIC METABOLIC PANEL
BUN: 11 (ref 4–21)
CO2: 19 (ref 13–22)
Chloride: 104 (ref 99–108)
Creatinine: 1.3 — AB (ref 0.5–1.1)
Glucose: 105
Potassium: 4 (ref 3.4–5.3)
Sodium: 140 (ref 137–147)

## 2021-01-01 LAB — COMPREHENSIVE METABOLIC PANEL
Albumin: 4.5 (ref 3.5–5.0)
Calcium: 9.4 (ref 8.7–10.7)
GFR calc Af Amer: 52
GFR calc non Af Amer: 45
Globulin: 3.1

## 2021-01-01 LAB — LIPID PANEL
Cholesterol: 415 — AB (ref 0–200)
HDL: 10 — AB (ref 35–70)
LDL Cholesterol: 303
Triglycerides: 60 (ref 40–160)

## 2021-01-01 LAB — HEPATIC FUNCTION PANEL
ALT: 18 (ref 7–35)
AST: 31 (ref 13–35)
Alkaline Phosphatase: 83 (ref 25–125)
Bilirubin, Total: 0.6

## 2021-01-01 LAB — TSH: TSH: 122.6 — AB (ref 0.41–5.90)

## 2021-01-07 ENCOUNTER — Other Ambulatory Visit: Payer: Self-pay

## 2021-01-07 ENCOUNTER — Encounter: Payer: Self-pay | Admitting: "Endocrinology

## 2021-01-07 ENCOUNTER — Ambulatory Visit (INDEPENDENT_AMBULATORY_CARE_PROVIDER_SITE_OTHER): Payer: Managed Care, Other (non HMO) | Admitting: "Endocrinology

## 2021-01-07 VITALS — BP 151/89 | HR 86 | Ht 69.5 in | Wt 223.6 lb

## 2021-01-07 DIAGNOSIS — E890 Postprocedural hypothyroidism: Secondary | ICD-10-CM

## 2021-01-07 MED ORDER — LEVOTHYROXINE SODIUM 75 MCG PO TABS: 75.0000 ug | ORAL_TABLET | Freq: Every day | ORAL | 0 refills | Status: DC

## 2021-01-07 NOTE — Progress Notes (Signed)
01/07/2021, 4:11 PM                                     Endocrinology follow-up note   Subjective:    Patient ID: Margaret Castro, female    DOB: February 26, 1956, PCP Verlee Monte, NP   Past Medical History:  Diagnosis Date   Hyperlipidemia    Hypertension    History reviewed. No pertinent surgical history. Social History   Socioeconomic History   Marital status: Single    Spouse name: Not on file   Number of children: Not on file   Years of education: Not on file   Highest education level: Not on file  Occupational History   Not on file  Tobacco Use   Smoking status: Never   Smokeless tobacco: Never  Vaping Use   Vaping Use: Never used  Substance and Sexual Activity   Alcohol use: Not Currently   Drug use: Never   Sexual activity: Not on file  Other Topics Concern   Not on file  Social History Narrative   Not on file   Social Determinants of Health   Financial Resource Strain: Not on file  Food Insecurity: Not on file  Transportation Needs: Not on file  Physical Activity: Not on file  Stress: Not on file  Social Connections: Not on file   Outpatient Encounter Medications as of 01/07/2021  Medication Sig   levothyroxine (SYNTHROID) 75 MCG tablet Take 1 tablet (75 mcg total) by mouth daily before breakfast.   lisinopril (ZESTRIL) 10 MG tablet Take 10 mg by mouth daily.   omeprazole (PRILOSEC) 40 MG capsule Take 40 mg by mouth daily.   rosuvastatin (CRESTOR) 20 MG tablet Take 1 tablet by mouth daily.   No facility-administered encounter medications on file as of 01/07/2021.   ALLERGIES: No Known Allergies  VACCINATION STATUS:  There is no immunization history on file for this patient.  HPI Margaret Castro is 65 y.o. female who presents today for follow-up after she was seen on previous encounters for recurrent/relapsing hyperthyroidism.  Her subsequent thyroid uptake and scan was confirmatory for  Graves' disease with 52% uptake in 24 hours.    -After preparation with low-dose prednisone and beta-blocker, she was given radioactive iodine thyroid ablation which she received on September 30, 2020.  She continued to improve clinically with resolution of her prior symptoms of palpitation, tremor, anxiety. -Her previsit thyroid function tests are consistent with treatment effect and now hypothyroid state.  She is not on thyroid hormone replacement yet. She presents with progressive weight gain.  Her previous symptoms of hyperthyroidism have resolved.  In the interim, she was diagnosed with hypertension and hyperlipidemia, wishes to continue to address with PCP.   -She has 2 sisters with thyroid problems, 1 of them status post thyroidectomy on thyroid hormone replacement.  Another sister with treatment which appears to be radioactive iodine ablation.  She denies family history of thyroid cancer. -She denies any exposure to neck radiation.   -She denies  dysphagia, shortness of breath, change in her voice.   Review of Systems -Limited as above.  Objective:    BP (!) 151/89   Pulse 86   Ht 5' 9.5" (1.765 m)   Wt 223 lb 9.6 oz (101.4 kg)   BMI 32.55 kg/m   Wt Readings from Last 3 Encounters:  01/07/21 223 lb 9.6 oz (101.4 kg)  11/22/20 217 lb 9.6 oz (98.7 kg)  09/20/20 213 lb (96.6 kg)    Physical Exam    Recent Results (from the past 2160 hour(s))  TSH     Status: Abnormal   Collection Time: 11/16/20 12:00 AM  Result Value Ref Range   TSH 0.01 (A) 0.41 - 5.90    Comment: FREE T4-1.40, FREE T3- 2.8  Basic metabolic panel     Status: Abnormal   Collection Time: 01/01/21 12:00 AM  Result Value Ref Range   Glucose 105    BUN 11 4 - 21   CO2 19 13 - 22   Creatinine 1.3 (A) 0.5 - 1.1   Potassium 4.0 3.4 - 5.3   Sodium 140 137 - 147   Chloride 104 99 - 108  Comprehensive metabolic panel     Status: None   Collection Time: 01/01/21 12:00 AM  Result Value Ref Range   Globulin  3.1    GFR calc Af Amer 52    GFR calc non Af Amer 45    Calcium 9.4 8.7 - 10.7   Albumin 4.5 3.5 - 5.0  Lipid panel     Status: Abnormal   Collection Time: 01/01/21 12:00 AM  Result Value Ref Range   Triglycerides 60 40 - 160   Cholesterol 415 (A) 0 - 200   HDL 10 (A) 35 - 70   LDL Cholesterol 303   Hepatic function panel     Status: None   Collection Time: 01/01/21 12:00 AM  Result Value Ref Range   Alkaline Phosphatase 83 25 - 125   ALT 18 7 - 35   AST 31 13 - 35   Bilirubin, Total 0.6   TSH     Status: Abnormal   Collection Time: 01/01/21 12:00 AM  Result Value Ref Range   TSH 100.00 (A) 0.41 - 5.90    Comment: Actual result of TSH 122.600, T4,Fre 0.11,T3,Free <0.4     Assessment & Plan:   1.  RAI induced hypothyroidism  She is status post RAI thyroid ablation on September 30, 2020.  Her pretreatment work-up confirmed Graves' disease with 52% uptake in 24 hours.  Her previsit labs are consistent with treatment effect and now RAI induced hypothyroidism.  She would benefit from early initiation of thyroid hormone replacement.  I discussed and prescribed levothyroxine 75 mcg p.o. daily before breakfast.   - We discussed about the correct intake of her thyroid hormone, on empty stomach at fasting, with water, separated by at least 30 minutes from breakfast and other medications,  and separated by more than 4 hours from calcium, iron, multivitamins, acid reflux medications (PPIs). -Patient is made aware of the fact that thyroid hormone replacement is needed for life, dose to be adjusted by periodic monitoring of thyroid function tests.  -For hyperlipidemia, she was initiated on Crestor 20 mg p.o. nightly by her PCP along with lisinopril 10 mg p.o. daily for hypertension management.   - I advised patient to maintain close follow up with [name], NP for primary care needs.    I spent 25 minutes in the care of the patient today including review of labs from Thyroid Function,  and other relevant labs ; imaging/biopsy records (current and previous including abstractions from other facilities); face-to-face time discussing  her lab results and symptoms, medications doses, her options of short and long term treatment based on the latest standards of care / guidelines;   and documenting the encounter.  [name]  participated in the discussions, expressed understanding, and voiced agreement with the above plans.  All questions were answered to her satisfaction. she is encouraged to contact clinic should she have any questions or concerns prior to her return visit.   Follow up plan: Return in about 3 months (around 04/09/2021) for F/U with Pre-visit Labs.   Marquis Lunch, MD [facility] [address] [place], [place] [address] Phone: [phone]  Fax: [phone]     01/07/2021, 4:11 PM  This note was partially dictated with voice recognition software. Similar sounding words can be transcribed inadequately or may not  be corrected upon review.

## 2021-01-30 ENCOUNTER — Telehealth: Payer: Self-pay | Admitting: Nurse Practitioner

## 2021-01-30 DIAGNOSIS — E890 Postprocedural hypothyroidism: Secondary | ICD-10-CM

## 2021-01-30 NOTE — Telephone Encounter (Signed)
Pt called and states she feels that her levothyroxine dosage is too high, she states she is experiencing racing heart beat, nausea and weak. Requesting to speak about it [phone]

## 2021-01-30 NOTE — Telephone Encounter (Signed)
Pt notified and agrees. Orders were faxed to the pts PCP per request

## 2021-02-05 ENCOUNTER — Telehealth: Payer: Self-pay | Admitting: Nurse Practitioner

## 2021-02-05 NOTE — Telephone Encounter (Signed)
Spoke with Erie Noe. She agrees with recommendations. She comes back in Sept and will have labs again 1 week prior to appt.

## 2021-04-01 LAB — LIPID PANEL
Cholesterol: 165 (ref 0–200)
HDL: 65 (ref 35–70)
LDL Cholesterol: 90
LDl/HDL Ratio: 1.4
Triglycerides: 51 (ref 40–160)

## 2021-04-01 LAB — TSH: TSH: 9.14 — AB (ref <=5.90)

## 2021-04-07 ENCOUNTER — Other Ambulatory Visit: Payer: Self-pay

## 2021-04-07 ENCOUNTER — Encounter: Payer: Self-pay | Admitting: "Endocrinology

## 2021-04-07 ENCOUNTER — Ambulatory Visit (INDEPENDENT_AMBULATORY_CARE_PROVIDER_SITE_OTHER): Payer: Managed Care, Other (non HMO) | Admitting: "Endocrinology

## 2021-04-07 VITALS — BP 122/66 | HR 64 | Ht 69.5 in | Wt 219.6 lb

## 2021-04-07 DIAGNOSIS — E890 Postprocedural hypothyroidism: Secondary | ICD-10-CM | POA: Diagnosis not present

## 2021-04-07 MED ORDER — LEVOTHYROXINE SODIUM 75 MCG PO TABS: 75.0000 ug | ORAL_TABLET | Freq: Every day | ORAL | 1 refills | Status: DC

## 2021-04-07 NOTE — Progress Notes (Signed)
04/07/2021, 6:06 PM                                     Endocrinology follow-up note   Subjective:    Patient ID: Margaret Castro, female    DOB: Feb 02, 1956, PCP Verlee Monte, NP   Past Medical History:  Diagnosis Date   Hyperlipidemia    Hypertension    History reviewed. No pertinent surgical history. Social History   Socioeconomic History   Marital status: Single    Spouse name: Not on file   Number of children: Not on file   Years of education: Not on file   Highest education level: Not on file  Occupational History   Not on file  Tobacco Use   Smoking status: Never   Smokeless tobacco: Never  Vaping Use   Vaping Use: Never used  Substance and Sexual Activity   Alcohol use: Not Currently   Drug use: Never   Sexual activity: Not on file  Other Topics Concern   Not on file  Social History Narrative   Not on file   Social Determinants of Health   Financial Resource Strain: Not on file  Food Insecurity: Not on file  Transportation Needs: Not on file  Physical Activity: Not on file  Stress: Not on file  Social Connections: Not on file   Outpatient Encounter Medications as of 04/07/2021  Medication Sig   levothyroxine (SYNTHROID) 75 MCG tablet Take 1 tablet (75 mcg total) by mouth daily before breakfast.   lisinopril (ZESTRIL) 10 MG tablet Take 10 mg by mouth daily.   metoprolol succinate (TOPROL-XL) 25 MG 24 hr tablet Take 25 mg by mouth daily as needed.   omeprazole (PRILOSEC) 40 MG capsule Take 40 mg by mouth daily.   rosuvastatin (CRESTOR) 20 MG tablet Take 1 tablet by mouth daily.   [DISCONTINUED] levothyroxine (SYNTHROID) 75 MCG tablet Take 1 tablet (75 mcg total) by mouth daily before breakfast.   No facility-administered encounter medications on file as of 04/07/2021.   ALLERGIES: No Known Allergies  VACCINATION STATUS:  There is no immunization history on file for this  patient.  HPI Margaret Castro is 65 y.o. female who presents today to follow-up for hypothyroidism.  She was given thyroid ablative therapy with I-131 to treat Graves' disease with clinically significant hypothyroidism.  Her treatment was administered in March 2022. -She is currently on levothyroxine 75 mcg p.o. daily before breakfast.  Her previsit thyroid function tests are consistent with improving, near target replacement dose of levothyroxine.   Her previous symptoms of hyperthyroidism have resolved.  In the interim, she was diagnosed with hypertension and hyperlipidemia, wishes to continue to address with PCP.   -She has 2 sisters with thyroid problems, 1 of them status post thyroidectomy on thyroid hormone replacement.  Another sister with treatment which appears to be radioactive iodine ablation.  She denies family history of thyroid cancer. -She denies any exposure to neck radiation.   -She denies  dysphagia, shortness of breath, change in her voice.   Review of Systems -Limited as above.    Objective:    BP  122/66   Pulse 64   Ht 5' 9.5" (1.765 m)   Wt 219 lb 9.6 oz (99.6 kg)   BMI 31.96 kg/m   Wt Readings from Last 3 Encounters:  04/07/21 219 lb 9.6 oz (99.6 kg)  01/07/21 223 lb 9.6 oz (101.4 kg)  11/22/20 217 lb 9.6 oz (98.7 kg)    Physical Exam    Recent Results (from the past 2160 hour(s))  Lipid panel     Status: None   Collection Time: 04/01/21 12:00 AM  Result Value Ref Range   LDl/HDL Ratio 1.4    Triglycerides 51 40 - 160   Cholesterol 165 0 - 200   HDL 65 35 - 70   LDL Cholesterol 90   TSH     Status: Abnormal   Collection Time: 04/01/21 12:00 AM  Result Value Ref Range   TSH 9.14 (A) 0.41 - 5.90    Comment: Free T4 1.69     Assessment & Plan:   1.  RAI induced hypothyroidism  She is status post RAI thyroid ablation on September 30, 2020.  Her pretreatment work-up confirmed Graves' disease with 52% uptake in 24 hours.  Her previsit thyroid  function tests are consistent with appropriate replacement.  She is advised to continue levothyroxine 75 mcg p.o. daily before breakfast.     - We discussed about the correct intake of her thyroid hormone, on empty stomach at fasting, with water, separated by at least 30 minutes from breakfast and other medications,  and separated by more than 4 hours from calcium, iron, multivitamins, acid reflux medications (PPIs). -Patient is made aware of the fact that thyroid hormone replacement is needed for life, dose to be adjusted by periodic monitoring of thyroid function tests.   -For hyperlipidemia, she was initiated on Crestor 20 mg p.o. nightly by her PCP along with lisinopril 10 mg p.o. daily for hypertension management.  This intervention has helped lowering her LDL to 90 from 303.   - I advised patient to maintain close follow up with [name], NP for primary care needs.    I spent 21 minutes in the care of the patient today including review of labs from Thyroid Function, CMP, and other relevant labs ; imaging/biopsy records (current and previous including abstractions from other facilities); face-to-face time discussing  her lab results and symptoms, medications doses, her options of short and long term treatment based on the latest standards of care / guidelines;   and documenting the encounter.  [name]  participated in the discussions, expressed understanding, and voiced agreement with the above plans.  All questions were answered to her satisfaction. she is encouraged to contact clinic should she have any questions or concerns prior to her return visit.    Follow up plan: Return in about 4 months (around 08/07/2021) for F/U with Pre-visit Labs.   Marquis Lunch, MD [facility] [address] [place], [place] [address] Phone: [phone]  Fax: [phone]     04/07/2021, 6:06 PM  This note was partially dictated with  voice recognition software. Similar sounding words can be transcribed inadequately or may not  be corrected upon review.

## 2021-04-10 ENCOUNTER — Ambulatory Visit: Payer: Managed Care, Other (non HMO) | Admitting: "Endocrinology

## 2021-07-04 LAB — TSH: TSH: 13.98 — AB (ref 0.41–5.90)

## 2021-08-07 ENCOUNTER — Ambulatory Visit (INDEPENDENT_AMBULATORY_CARE_PROVIDER_SITE_OTHER): Payer: Managed Care, Other (non HMO) | Admitting: "Endocrinology

## 2021-08-07 ENCOUNTER — Encounter: Payer: Self-pay | Admitting: "Endocrinology

## 2021-08-07 ENCOUNTER — Other Ambulatory Visit: Payer: Self-pay

## 2021-08-07 VITALS — BP 134/76 | HR 68 | Ht 69.5 in | Wt 232.2 lb

## 2021-08-07 DIAGNOSIS — E890 Postprocedural hypothyroidism: Secondary | ICD-10-CM

## 2021-08-07 MED ORDER — LEVOTHYROXINE SODIUM 100 MCG PO TABS: 100.0000 ug | ORAL_TABLET | Freq: Every day | ORAL | 0 refills | Status: DC

## 2021-08-07 NOTE — Progress Notes (Signed)
08/07/2021, 12:43 PM                                     Endocrinology follow-up note   Subjective:    Patient ID: Margaret Castro, female    DOB: [birth year], PCP Verlee Monte, NP   Past Medical History:  Diagnosis Date   Hyperlipidemia    Hypertension    History reviewed. No pertinent surgical history. Social History   Socioeconomic History   Marital status: Single    Spouse name: Not on file   Number of children: Not on file   Years of education: Not on file   Highest education level: Not on file  Occupational History   Not on file  Tobacco Use   Smoking status: Never   Smokeless tobacco: Never  Vaping Use   Vaping Use: Never used  Substance and Sexual Activity   Alcohol use: Not Currently   Drug use: Never   Sexual activity: Not on file  Other Topics Concern   Not on file  Social History Narrative   Not on file   Social Determinants of Health   Financial Resource Strain: Not on file  Food Insecurity: Not on file  Transportation Needs: Not on file  Physical Activity: Not on file  Stress: Not on file  Social Connections: Not on file   Outpatient Encounter Medications as of 08/07/2021  Medication Sig   levothyroxine (SYNTHROID) 100 MCG tablet Take 1 tablet (100 mcg total) by mouth daily before breakfast.   lisinopril (ZESTRIL) 10 MG tablet Take 10 mg by mouth daily.   metoprolol succinate (TOPROL-XL) 25 MG 24 hr tablet Take 25 mg by mouth daily as needed.   omeprazole (PRILOSEC) 40 MG capsule Take 40 mg by mouth daily.   rosuvastatin (CRESTOR) 20 MG tablet Take 1 tablet by mouth daily.   [DISCONTINUED] levothyroxine (SYNTHROID) 75 MCG tablet Take 1 tablet (75 mcg total) by mouth daily before breakfast.   No facility-administered encounter medications on file as of 08/07/2021.   ALLERGIES: No Known Allergies  VACCINATION STATUS:  There is no immunization history on file for this  patient.  HPI Margaret Castro is 66 y.o. female who presents today to follow-up for hypothyroidism.  She was given thyroid ablative therapy with I-131 to treat Graves' disease with clinically significant hypothyroidism.  Her treatment was administered in March 2022. -She is currently on levothyroxine 75 mcg p.o. daily before breakfast.  Her previsit thyroid function tests are consistent with under replacement.      Her previous symptoms of hyperthyroidism have resolved.  In the interim, she was diagnosed with hypertension and hyperlipidemia, wishes to continue to address with PCP. Presents with significant weight gain.  She denies cold/heat intolerance.   -She has 2 sisters with thyroid problems, 1 of them status post thyroidectomy on thyroid hormone replacement.  Another sister with treatment which appears to be radioactive iodine ablation.  She denies family history of thyroid cancer. -She denies any exposure to neck radiation.   -She denies  dysphagia, shortness of breath, change in her voice.   Review of Systems -Limited as above.  Objective:    BP 134/76    Pulse 68    Ht 5' 9.5" (1.765 m)    Wt 232 lb 3.2 oz (105.3 kg)    BMI 33.80 kg/m   Wt Readings from Last 3 Encounters:  08/07/21 232 lb 3.2 oz (105.3 kg)  04/07/21 219 lb 9.6 oz (99.6 kg)  01/07/21 223 lb 9.6 oz (101.4 kg)    Physical Exam    Recent Results (from the past 2160 hour(s))  TSH     Status: Abnormal   Collection Time: 07/04/21 12:00 AM  Result Value Ref Range   TSH 13.98 (A) 0.41 - 5.90    Comment: T4,Free1.43, T3,Total 72     Assessment & Plan:   1.  RAI induced hypothyroidism  She is status post RAI thyroid ablation on September 30, 2020.  Her pretreatment work-up confirmed Graves' disease with 52% uptake in 24 hours.  Her previsit thyroid function tests are consistent with under replacement.  I discussed and increased her levothyroxine to 100 mcg p.o. daily before breakfast.     - We discussed  about the correct intake of her thyroid hormone, on empty stomach at fasting, with water, separated by at least 30 minutes from breakfast and other medications,  and separated by more than 4 hours from calcium, iron, multivitamins, acid reflux medications (PPIs). -Patient is made aware of the fact that thyroid hormone replacement is needed for life, dose to be adjusted by periodic monitoring of thyroid function tests.    -For hyperlipidemia, she was initiated on Crestor 20 mg p.o. nightly by her PCP along with lisinopril 10 mg p.o. daily for hypertension management.  This intervention has helped lowering her LDL to 90 from 303.   - I advised patient to maintain close follow up with [name], NP for primary care needs.    I spent 21 minutes in the care of the patient today including review of labs from Thyroid Function, CMP, and other relevant labs ; imaging/biopsy records (current and previous including abstractions from other facilities); face-to-face time discussing  her lab results and symptoms, medications doses, her options of short and long term treatment based on the latest standards of care / guidelines;   and documenting the encounter.  [name]  participated in the discussions, expressed understanding, and voiced agreement with the above plans.  All questions were answered to her satisfaction. she is encouraged to contact clinic should she have any questions or concerns prior to her return visit.    Follow up plan: Return in about 3 months (around 11/05/2021) for F/U with Pre-visit Labs.   Marquis Lunch, MD [facility] [address] [place], [place] [address] Phone: [phone]  Fax: [phone]     08/07/2021, 12:43 PM  This note was partially dictated with voice recognition software. Similar sounding words can be transcribed inadequately or may not  be corrected upon review.

## 2021-10-31 LAB — TSH: TSH: 0.9 (ref 0.41–5.90)

## 2021-11-07 ENCOUNTER — Encounter: Payer: Self-pay | Admitting: "Endocrinology

## 2021-11-07 ENCOUNTER — Ambulatory Visit (INDEPENDENT_AMBULATORY_CARE_PROVIDER_SITE_OTHER): Payer: Managed Care, Other (non HMO) | Admitting: "Endocrinology

## 2021-11-07 VITALS — BP 120/74 | HR 60 | Ht 69.5 in | Wt 237.6 lb

## 2021-11-07 DIAGNOSIS — E890 Postprocedural hypothyroidism: Secondary | ICD-10-CM

## 2021-11-07 MED ORDER — LEVOTHYROXINE SODIUM 100 MCG PO TABS: 100.0000 ug | ORAL_TABLET | Freq: Every day | ORAL | 1 refills | Status: DC

## 2021-11-07 NOTE — Progress Notes (Signed)
? ?    ?                                           11/07/2021, 10:48 AM ?             ?                  ?     ?Endocrinology follow-up note ? ? ?Subjective:  ? ? Patient ID: Margaret Castro, female    DOB: February 01, 1956, PCP Verlee Monte, NP ? ? ?Past Medical History:  ?Diagnosis Date  ? Hyperlipidemia   ? Hypertension   ? ?History reviewed. No pertinent surgical history. ?Social History  ? ?Socioeconomic History  ? Marital status: Single  ?  Spouse name: Not on file  ? Number of children: Not on file  ? Years of education: Not on file  ? Highest education level: Not on file  ?Occupational History  ? Not on file  ?Tobacco Use  ? Smoking status: Never  ? Smokeless tobacco: Never  ?Vaping Use  ? Vaping Use: Never used  ?Substance and Sexual Activity  ? Alcohol use: Not Currently  ? Drug use: Never  ? Sexual activity: Not on file  ?Other Topics Concern  ? Not on file  ?Social History Narrative  ? Not on file  ? ?Social Determinants of Health  ? ?Financial Resource Strain: Not on file  ?Food Insecurity: Not on file  ?Transportation Needs: Not on file  ?Physical Activity: Not on file  ?Stress: Not on file  ?Social Connections: Not on file  ? ?Outpatient Encounter Medications as of 11/07/2021  ?Medication Sig  ? levothyroxine (SYNTHROID) 100 MCG tablet Take 1 tablet (100 mcg total) by mouth daily before breakfast.  ? lisinopril (ZESTRIL) 10 MG tablet Take 10 mg by mouth daily.  ? metoprolol succinate (TOPROL-XL) 25 MG 24 hr tablet Take 25 mg by mouth daily as needed.  ? omeprazole (PRILOSEC) 40 MG capsule Take 40 mg by mouth daily.  ? rosuvastatin (CRESTOR) 20 MG tablet Take 1 tablet by mouth daily.  ? [DISCONTINUED] levothyroxine (SYNTHROID) 100 MCG tablet Take 1 tablet (100 mcg total) by mouth daily before breakfast.  ? ?No facility-administered encounter medications on file as of 11/07/2021.  ? ?ALLERGIES: ?No Known Allergies ? ?VACCINATION STATUS: ? ?There is no immunization history on file for this  patient. ? ?HPI ?Margaret Castro is 66 y.o. female who presents today to follow-up for hypothyroidism.  She was given thyroid ablative therapy with I-131 to treat Graves' disease with clinically significant hypothyroidism.  Her treatment was administered in March 2022. ?-She is currently on 100 mcg p.o. daily before breakfast.   Her previsit thyroid function tests are consistent with appropriate replacement.     ? ?  Her previous symptoms of hyperthyroidism have resolved.  In the interim, she was diagnosed with hypertension and hyperlipidemia, wishes to continue to address with PCP.  She is currently on Crestor 20 mg p.o. nightly. ?She denies palpitations, tremors, heat intolerance.  Her major concern still is inability to lose weight. ? ? -She has 2 sisters with thyroid problems, 1 of them status post thyroidectomy on thyroid hormone replacement.  Another sister with treatment which appears to be radioactive iodine ablation.  She denies family history of thyroid cancer. ?-She denies any exposure to neck radiation.   ?-She  denies  dysphagia, shortness of breath, change in her voice. ? ? ?Review of Systems ?-Limited as above. ? ? ? ?Objective:  ?  ?BP 120/74   Pulse 60   Ht 5' 9.5" (1.765 m)   Wt 237 lb 9.6 oz (107.8 kg)   BMI 34.58 kg/m?   ?Wt Readings from Last 3 Encounters:  ?11/07/21 237 lb 9.6 oz (107.8 kg)  ?08/07/21 232 lb 3.2 oz (105.3 kg)  ?04/07/21 219 lb 9.6 oz (99.6 kg)  ?  ?Physical Exam ? ? ? ?Recent Results (from the past 2160 hour(s))  ?TSH     Status: None  ? Collection Time: 10/31/21 12:00 AM  ?Result Value Ref Range  ? TSH 0.90 0.41 - 5.90  ?  Comment: FREE T4 1.85  ? ? ? ?Assessment & Plan:  ? ?1.  RAI induced hypothyroidism ? ?She is status post RAI thyroid ablation on September 30, 2020.  Her pretreatment work-up confirmed Graves' disease with 52% uptake in 24 hours.  Her previsit thyroid function tests are consistent with appropriate replacement.  She will continue to benefit from her current  dose of levothyroxine at 100 mcg p.o. daily before breakfast.   ? ? - We discussed about the correct intake of her thyroid hormone, on empty stomach at fasting, with water, separated by at least 30 minutes from breakfast and other medications,  and separated by more than 4 hours from calcium, iron, multivitamins, acid reflux medications (PPIs). ?-Patient is made aware of the fact that thyroid hormone replacement is needed for life, dose to be adjusted by periodic monitoring of thyroid function tests. ? ? ? ?-For hyperlipidemia, she was initiated on Crestor 20 mg p.o. nightly by her PCP along with lisinopril 10 mg p.o. daily for hypertension management.  This intervention has helped lowering her LDL to 90 from 303. ? ?Weight management will be discussed in detail during her next visit. ? ? ?- I advised patient to maintain close follow up with [name], NP for primary care needs. ? ? ? ?I spent 21 minutes in the care of the patient today including review of labs from Thyroid Function, CMP, and other relevant labs ; imaging/biopsy records (current and previous including abstractions from other facilities); face-to-face time discussing  her lab results and symptoms, medications doses, her options of short and long term treatment based on the latest standards of care / guidelines;   and documenting the encounter. ? ?Margaret Castro  participated in the discussions, expressed understanding, and voiced agreement with the above plans.  All questions were answered to her satisfaction. she is encouraged to contact clinic should she have any questions or concerns prior to her return visit. ? ? ? ?Follow up plan: ?Return in about 6 months (around 05/09/2022) for F/U with Pre-visit Labs. ? ? ?Marquis Lunch, MD ?[facility] ?[facility] ?[address] ?[place], [place] [address] ?Phone: [phone]  Fax: [phone]   ? ? ?11/07/2021, 10:48 AM ? ?This note was partially dictated with  voice recognition software. Similar sounding words can be transcribed inadequately or may not  be corrected upon review. ? ?

## 2021-11-08 ENCOUNTER — Other Ambulatory Visit: Payer: Self-pay | Admitting: "Endocrinology

## 2022-03-17 HISTORY — PX: CHOLECYSTECTOMY: SHX55

## 2022-04-21 IMAGING — NM NM THYROID IMAGING W/ UPTAKE MULTI (4&24 HR)
4 series · 4 of 4 positions shown · non-contrast
Comparison: 12/02/2017

CLINICAL DATA: Hyperthyroidism, nervousness, diarrhea, difficulty
sleeping, palpitations, fatigue, hair loss

EXAM:
THYROID SCAN AND UPTAKE - 4 AND 24 HOURS
TECHNIQUE: Following oral administration of I-FWU capsule, anterior planar
imaging was acquired at 24 hours. Thyroid uptake was calculated with
a thyroid probe at 4-6 hours and 24 hours.
RADIOPHARMACEUTICALS:  438 uCi I-FWU sodium iodide p.o.

[Series 1: anterior · 1.18mm/px · 1 of 1 slices shown]
[im 1/1]
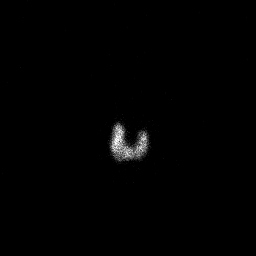

[Series 2: ant w marker · 1.18mm/px · 1 of 1 slices shown]
[im 1/1  full-range]
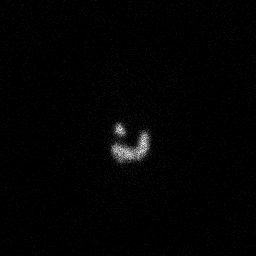

[Series 3: lao · 1.18mm/px · 1 of 1 slices shown]
[im 1/1  full-range]
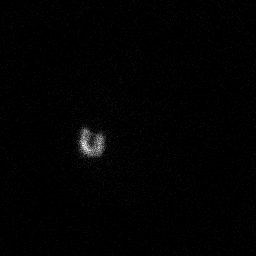

[Series 4: rao · 1.18mm/px · 1 of 1 slices shown]
[im 1/1]
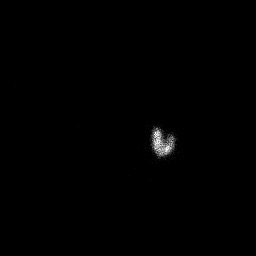

[4 of 4 positions shown; findings below may reference images not displayed]

FINDINGS: Homogeneous tracer distribution in both thyroid lobes.

No focal areas of increased or decreased tracer localization.

4 hour I-FWU uptake = 20% (normal 5-20%)

24 hour I-FWU uptake = 52% (normal 10-30%)
IMPRESSION: Elevated 4 hour and 24 hour radio iodine uptakes with homogeneous
tracer distribution in both thyroid lobes.

Findings consistent with Graves disease.

## 2022-05-03 ENCOUNTER — Other Ambulatory Visit: Payer: Self-pay | Admitting: "Endocrinology

## 2022-05-12 ENCOUNTER — Ambulatory Visit (INDEPENDENT_AMBULATORY_CARE_PROVIDER_SITE_OTHER): Payer: Managed Care, Other (non HMO) | Admitting: "Endocrinology

## 2022-05-12 ENCOUNTER — Encounter: Payer: Self-pay | Admitting: "Endocrinology

## 2022-05-12 VITALS — BP 136/78 | HR 68 | Ht 69.5 in | Wt 227.6 lb

## 2022-05-12 DIAGNOSIS — I10 Essential (primary) hypertension: Secondary | ICD-10-CM

## 2022-05-12 DIAGNOSIS — E890 Postprocedural hypothyroidism: Secondary | ICD-10-CM

## 2022-05-12 DIAGNOSIS — E782 Mixed hyperlipidemia: Secondary | ICD-10-CM

## 2022-05-12 MED ORDER — LEVOTHYROXINE SODIUM 88 MCG PO TABS: 88.0000 ug | ORAL_TABLET | Freq: Every day | ORAL | 1 refills | Status: DC

## 2022-05-12 NOTE — Patient Instructions (Signed)
                                     Advice for Weight Management  -For most of us the best way to lose weight is by diet management. Generally speaking, diet management means consuming less calories intentionally which over time brings about progressive weight loss.  This can be achieved more effectively by avoiding ultra processed carbohydrates, processed meats, unhealthy fats.    It is critically important to know your numbers: how much calorie you are consuming and how much calorie you need. More importantly, our carbohydrates sources should be unprocessed naturally occurring  complex starch food items.  It is always important to balance nutrition also by  appropriate intake of proteins (mainly plant-based), healthy fats/oils, plenty of fruits and vegetables.   -The American College of Lifestyle Medicine (ACL M) recommends nutrition derived mostly from Whole Food, Plant Predominant Sources example an apple instead of applesauce or apple pie. Eat Plenty of vegetables, Mushrooms, fruits, Legumes, Whole Grains, Nuts, seeds in lieu of processed meats, processed snacks/pastries red meat, poultry, eggs.  Use only water or unsweetened tea for hydration.  The College also recommends the need to stay away from risky substances including alcohol, smoking; obtaining 7-9 hours of restorative sleep, at least 150 minutes of moderate intensity exercise weekly, importance of healthy social connections, and being mindful of stress and seek help when it is overwhelming.    -Sticking to a routine mealtime to eat 3 meals a day and avoiding unnecessary snacks is shown to have a big role in weight control. Under normal circumstances, the only time we burn stored energy is when we are hungry, so allow  some hunger to take place- hunger means no food between appropriate meal times, only water.  It is not advisable to starve.   -It is better to avoid simple carbohydrates including:  Cakes, Sweet Desserts, Ice Cream, Soda (diet and regular), Sweet Tea, Candies, Chips, Cookies, Store Bought Juices, Alcohol in Excess of  1-2 drinks a day, Lemonade,  Artificial Sweeteners, Doughnuts, Coffee Creamers, "Sugar-free" Products, etc, etc.  This is not a complete list.....    -Consulting with certified diabetes educators is proven to provide you with the most accurate and current information on diet.  Also, you may be  interested in discussing diet options/exchanges , we can schedule a visit with Margaret Castro, RDN, CDE for individualized nutrition education.  -Exercise: If you are able: 30 -60 minutes a day ,4 days a week, or 150 minutes of moderate intensity exercise weekly.    The longer the better if tolerated.  Combine stretch, strength, and aerobic activities.  If you were told in the past that you have high risk for cardiovascular diseases, or if you are currently symptomatic, you may seek evaluation by your heart doctor prior to initiating moderate to intense exercise programs.                                  Additional Care Considerations for Diabetes/Prediabetes   -Diabetes  is a chronic disease.  The most important care consideration is regular follow-up with your diabetes care provider with the goal being avoiding or delaying its complications and to take advantage of advances in medications and technology.  If appropriate actions are taken early enough, type 2 diabetes can even be   reversed.  Seek information from the right source.  - Whole Food, Plant Predominant Nutrition is highly recommended: Eat Plenty of vegetables, Mushrooms, fruits, Legumes, Whole Grains, Nuts, seeds in lieu of processed meats, processed snacks/pastries red meat, poultry, eggs as recommended by American College of  Lifestyle Medicine (ACLM).  -Type 2 diabetes is known to coexist with other important comorbidities such as high blood pressure and high cholesterol.  It is critical to control not only the  diabetes but also the high blood pressure and high cholesterol to minimize and delay the risk of complications including coronary artery disease, stroke, amputations, blindness, etc.  The good news is that this diet recommendation for type 2 diabetes is also very helpful for managing high cholesterol and high blood blood pressure.  - Studies showed that people with diabetes will benefit from a class of medications known as ACE inhibitors and statins.  Unless there are specific reasons not to be on these medications, the standard of care is to consider getting one from these groups of medications at an optimal doses.  These medications are generally considered safe and proven to help protect the heart and the kidneys.    - People with diabetes are encouraged to initiate and maintain regular follow-up with eye doctors, foot doctors, dentists , and if necessary heart and kidney doctors.     - It is highly recommended that people with diabetes quit smoking or stay away from smoking, and get yearly  flu vaccine and pneumonia vaccine at least every 5 years.  See above for additional recommendations on exercise, sleep, stress management , and healthy social connections.      

## 2022-05-12 NOTE — Progress Notes (Signed)
05/12/2022, 2:54 PM                                     Endocrinology follow-up note   Subjective:    Patient ID: Margaret Castro, female    DOB: Jan 15, 1956, PCP Verlee Monte, NP   Past Medical History:  Diagnosis Date   Hyperlipidemia    Hypertension    Past Surgical History:  Procedure Laterality Date   CHOLECYSTECTOMY  03/17/2022   Social History   Socioeconomic History   Marital status: Single    Spouse name: Not on file   Number of children: Not on file   Years of education: Not on file   Highest education level: Not on file  Occupational History   Not on file  Tobacco Use   Smoking status: Never   Smokeless tobacco: Never  Vaping Use   Vaping Use: Never used  Substance and Sexual Activity   Alcohol use: Not Currently   Drug use: Never   Sexual activity: Not on file  Other Topics Concern   Not on file  Social History Narrative   Not on file   Social Determinants of Health   Financial Resource Strain: Not on file  Food Insecurity: Not on file  Transportation Needs: Not on file  Physical Activity: Not on file  Stress: Not on file  Social Connections: Not on file   Outpatient Encounter Medications as of 05/12/2022  Medication Sig   levothyroxine (SYNTHROID) 88 MCG tablet Take 1 tablet (88 mcg total) by mouth daily before breakfast.   lisinopril (ZESTRIL) 10 MG tablet Take 10 mg by mouth daily.   metoprolol succinate (TOPROL-XL) 25 MG 24 hr tablet Take 25 mg by mouth daily as needed.   omeprazole (PRILOSEC) 40 MG capsule Take 40 mg by mouth daily.   rosuvastatin (CRESTOR) 20 MG tablet Take 1 tablet by mouth daily.   [DISCONTINUED] levothyroxine (SYNTHROID) 100 MCG tablet TAKE ONE TABLET BY MOUTH DAILY BEFORE BREAKFAST   No facility-administered encounter medications on file as of 05/12/2022.   ALLERGIES: No Known Allergies  VACCINATION STATUS:  There is no immunization history on file for  this patient.  HPI KENDELL Castro is 66 y.o. female who presents today to follow-up for hypothyroidism.  She was given thyroid ablative therapy with I-131 to treat Graves' disease with clinically significant hypothyroidism.  Her treatment was administered in March 2022. -She is currently on 100 mcg p.o. daily before breakfast.   Her previsit thyroid function tests are consistent with slight over-replacement.      Her previous symptoms of hyperthyroidism have resolved.  Recently, she was diagnosed with hypertension and hyperlipidemia.     She is currently on Crestor 20 mg p.o. nightly.  She is also on lisinopril and beta-blocker for hypertension.  She wishes to explore ways to come off of medications. She denies palpitations, tremors, heat intolerance.  Her major concern still is inability to lose weight.   -She has 2 sisters with thyroid problems, 1 of them status post thyroidectomy on thyroid hormone replacement.  Another sister with treatment which appears to be radioactive iodine ablation.  She denies family history of thyroid cancer. -She denies any exposure to neck radiation.   -She denies  dysphagia, shortness of breath, change in her voice.   Review of Systems -Limited as above.    Objective:    BP 136/78   Pulse 68   Ht 5' 9.5" (1.765 m)   Wt 227 lb 9.6 oz (103.2 kg)   BMI 33.13 kg/m   Wt Readings from Last 3 Encounters:  05/12/22 227 lb 9.6 oz (103.2 kg)  11/07/21 237 lb 9.6 oz (107.8 kg)  08/07/21 232 lb 3.2 oz (105.3 kg)    Physical Exam    No results found for this or any previous visit (from the past 2160 hour(s)).    Assessment & Plan:   1.  RAI induced hypothyroidism  2.  Hyperlipidemia  3.  Hypertension  She is status post RAI thyroid ablation on September 30, 2020.  Her pretreatment work-up confirmed Graves' disease with 52% uptake in 24 hours.  Her previsit thyroid function tests are consistent with slight over-replacement.  I discussed and lowered her  levothyroxine to 88 mcg p.o. daily before breakfast.     - We discussed about the correct intake of her thyroid hormone, on empty stomach at fasting, with water, separated by at least 30 minutes from breakfast and other medications,  and separated by more than 4 hours from calcium, iron, multivitamins, acid reflux medications (PPIs). -Patient is made aware of the fact that thyroid hormone replacement is needed for life, dose to be adjusted by periodic monitoring of thyroid function tests.   In light of her metabolic dysfunction with hyperlipidemia, obesity, hypertension, she is a good candidate for lifestyle medicine.  - she acknowledges that there is a room for improvement in her food and drink choices. - Suggestion is made for her to avoid simple carbohydrates  from her diet including Cakes, Sweet Desserts, Ice Cream, Soda (diet and regular), Sweet Tea, Candies, Chips, Cookies, Store Bought Juices, Alcohol , Artificial Sweeteners,  Coffee Creamer, and "Sugar-free" Products, Lemonade. This will help patient to have more stable blood glucose profile and potentially avoid unintended weight gain.  The following Lifestyle Medicine recommendations according to [place]  [facility]) were discussed and and offered to patient and she  agrees to start the journey:  A. Whole Foods, Plant-Based Nutrition comprising of fruits and vegetables, plant-based proteins, whole-grain carbohydrates was discussed in detail with the patient.   A list for source of those nutrients were also provided to the patient.  Patient will use only water or unsweetened tea for hydration. B.  The need to stay away from risky substances including alcohol, smoking; obtaining 7 to 9 hours of restorative sleep, at least 150 minutes of moderate intensity exercise weekly, the importance of healthy social connections,  and stress management techniques were discussed. C.  A full color page of  Calorie density of various  food groups per pound showing examples of each food groups was provided to the patient.    -In the meantime, she is advised to continue Crestor 20 mg p.o. daily at bedtime, continue lisinopril 10 mg p.o. daily at breakfast, metoprolol 25 mg p.o. daily at breakfast.  Her MDM is 107, increasing from 90.  Reviewed  - I advised patient to maintain close follow up with [place] Georgeanna Lea, NP for primary care needs.   I spent 33 minutes in the care of the patient today including review of labs from Thyroid Function, CMP,  and other relevant labs ; imaging/biopsy records (current and previous including abstractions from other facilities); face-to-face time discussing  her lab results and symptoms, medications doses, her options of short and long term treatment based on the latest standards of care / guidelines;   and documenting the encounter.  [name]  participated in the discussions, expressed understanding, and voiced agreement with the above plans.  All questions were answered to her satisfaction. she is encouraged to contact clinic should she have any questions or concerns prior to her return visit.   Follow up plan: Return in about 6 months (around 11/11/2022) for Fasting Labs  in AM B4 8.   Marquis Lunch, MD [facility] [address] [place], [place] [address] Phone: [phone]  Fax: [phone]     05/12/2022, 2:54 PM  This note was partially dictated with voice recognition software. Similar sounding words can be transcribed inadequately or may not  be corrected upon review.

## 2022-11-04 LAB — LIPID PANEL
Chol/HDL Ratio: 3.1 ratio (ref 0.0–4.4)
Cholesterol, Total: 208 mg/dL — ABNORMAL HIGH (ref 100–199)
HDL: 67 mg/dL (ref >39)
LDL Chol Calc (NIH): 130 mg/dL — ABNORMAL HIGH (ref 0–99)
Triglycerides: 64 mg/dL (ref 0–149)
VLDL Cholesterol Cal: 11 mg/dL (ref 5–40)

## 2022-11-04 LAB — TSH: TSH: 3.8 u[IU]/mL (ref 0.450–4.500)

## 2022-11-04 LAB — T4, FREE: Free T4: 1.87 ng/dL — ABNORMAL HIGH (ref 0.82–1.77)

## 2022-11-06 ENCOUNTER — Other Ambulatory Visit: Payer: Self-pay | Admitting: "Endocrinology

## 2022-11-11 ENCOUNTER — Ambulatory Visit: Payer: Medicare PPO | Admitting: "Endocrinology

## 2022-11-11 ENCOUNTER — Encounter: Payer: Self-pay | Admitting: "Endocrinology

## 2022-11-11 VITALS — BP 144/76 | HR 72 | Ht 69.5 in | Wt 236.4 lb

## 2022-11-11 DIAGNOSIS — E890 Postprocedural hypothyroidism: Secondary | ICD-10-CM

## 2022-11-11 DIAGNOSIS — E782 Mixed hyperlipidemia: Secondary | ICD-10-CM | POA: Diagnosis not present

## 2022-11-11 MED ORDER — LEVOTHYROXINE SODIUM 75 MCG PO TABS: 75.0000 ug | ORAL_TABLET | Freq: Every day | ORAL | 1 refills | Status: DC

## 2022-11-11 NOTE — Progress Notes (Unsigned)
11/11/2022, 3:37 PM                                     Endocrinology follow-up note   Subjective:    Patient ID: Margaret Castro, female    DOB: 11-30-1955, PCP Verlee Monte, NP   Past Medical History:  Diagnosis Date   Hyperlipidemia    Hypertension    Past Surgical History:  Procedure Laterality Date   CHOLECYSTECTOMY  03/17/2022   Social History   Socioeconomic History   Marital status: Single    Spouse name: Not on file   Number of children: Not on file   Years of education: Not on file   Highest education level: Not on file  Occupational History   Not on file  Tobacco Use   Smoking status: Never   Smokeless tobacco: Never  Vaping Use   Vaping Use: Never used  Substance and Sexual Activity   Alcohol use: Not Currently   Drug use: Never   Sexual activity: Not on file  Other Topics Concern   Not on file  Social History Narrative   Not on file   Social Determinants of Health   Financial Resource Strain: Not on file  Food Insecurity: Not on file  Transportation Needs: Not on file  Physical Activity: Not on file  Stress: Not on file  Social Connections: Not on file   Outpatient Encounter Medications as of 11/11/2022  Medication Sig   levothyroxine (SYNTHROID) 75 MCG tablet Take 1 tablet (75 mcg total) by mouth daily before breakfast.   lisinopril (ZESTRIL) 10 MG tablet Take 10 mg by mouth daily.   metoprolol succinate (TOPROL-XL) 25 MG 24 hr tablet Take 25 mg by mouth daily as needed.   omeprazole (PRILOSEC) 40 MG capsule Take 40 mg by mouth daily.   rosuvastatin (CRESTOR) 20 MG tablet Take 1 tablet by mouth daily.   [DISCONTINUED] levothyroxine (SYNTHROID) 88 MCG tablet TAKE 1 TABLET BY MOUTH DAILY BEFORE BREAKFAST   No facility-administered encounter medications on file as of 11/11/2022.   ALLERGIES: No Known Allergies  VACCINATION STATUS:  There is no immunization history on file for this  patient.  HPI Margaret Castro is 67 y.o. female who presents today to follow-up for hypothyroidism.  She was given thyroid ablative therapy with I-131 to treat Graves' disease with clinically significant hypothyroidism.  Her treatment was administered in March 2022. -She is currently on 88 mcg p.o. daily before breakfast.   Her previsit thyroid function tests are consistent with slight over-replacement.      Her previous symptoms of hyperthyroidism have resolved.  Recently, she was diagnosed with hypertension and hyperlipidemia.     She is currently on Crestor 20 mg p.o. nightly.  She is also on lisinopril and beta-blocker for hypertension.  She wishes to explore ways to come off of medications. She denies palpitations, tremors, heat intolerance.  Her major concern still is inability to lose weight.   -She has 2 sisters with thyroid problems, 1 of them status post thyroidectomy on thyroid hormone replacement.  Another sister with treatment which appears to be radioactive iodine ablation.  She denies family history of thyroid cancer. -She denies any exposure to neck radiation.   -She denies  dysphagia, shortness of breath, change in her voice.   Review of Systems -Limited as above.    Objective:    BP (!) 144/76 Comment: R arm with manuel cuff.  Pulse 72   Ht 5' 9.5" (1.765 m)   Wt 236 lb 6.4 oz (107.2 kg)   BMI 34.41 kg/m   Wt Readings from Last 3 Encounters:  11/11/22 236 lb 6.4 oz (107.2 kg)  05/12/22 227 lb 9.6 oz (103.2 kg)  11/07/21 237 lb 9.6 oz (107.8 kg)    Physical Exam    Recent Results (from the past 2160 hour(s))  Lipid panel     Status: Abnormal   Collection Time: 11/03/22 12:56 PM  Result Value Ref Range   Cholesterol, Total 208 (H) 100 - 199 mg/dL   Triglycerides 64 0 - 149 mg/dL   HDL 67 >16 mg/dL   VLDL Cholesterol Cal 11 5 - 40 mg/dL   LDL Chol Calc (NIH) 109 (H) 0 - 99 mg/dL   Chol/HDL Ratio 3.1 0.0 - 4.4 ratio    Comment:                                    T. Chol/HDL Ratio                                             Men  Women                               1/2 Avg.Risk  3.4    3.3                                   Avg.Risk  5.0    4.4                                2X Avg.Risk  9.6    7.1                                3X Avg.Risk 23.4   11.0   TSH     Status: None   Collection Time: 11/03/22 12:56 PM  Result Value Ref Range   TSH 3.800 0.450 - 4.500 uIU/mL  T4, free     Status: Abnormal   Collection Time: 11/03/22 12:56 PM  Result Value Ref Range   Free T4 1.87 (H) 0.82 - 1.77 ng/dL      Assessment & Plan:   1.  RAI induced hypothyroidism  2.  Hyperlipidemia  3.  Hypertension  She is status post RAI thyroid ablation on September 30, 2020.  Her pretreatment work-up confirmed Graves' disease with 52% uptake in 24 hours.  Her previsit thyroid function tests are consistent with slight over-replacement.  I discussed and lowered her levothyroxine to 75 mcg p.o. daily before breakfast.     - We discussed about the correct intake of her thyroid hormone, on empty stomach at fasting, with water, separated by at  least 30 minutes from breakfast and other medications,  and separated by more than 4 hours from calcium, iron, multivitamins, acid reflux medications (PPIs). -Patient is made aware of the fact that thyroid hormone replacement is needed for life, dose to be adjusted by periodic monitoring of thyroid function tests.  In light of her metabolic dysfunction with hyperlipidemia, obesity, hypertension, she has been  a good candidate for lifestyle medicine.  - she acknowledges that there is a room for improvement in her food and drink choices. - Suggestion is made for her to avoid simple carbohydrates  from her diet including Cakes, Sweet Desserts, Ice Cream, Soda (diet and regular), Sweet Tea, Candies, Chips, Cookies, Store Bought Juices, Alcohol , Artificial Sweeteners,  Coffee Creamer, and "Sugar-free" Products, Lemonade. This will help  patient to have more stable blood glucose profile and potentially avoid unintended weight gain.  The following Lifestyle Medicine recommendations according to American College of Lifestyle Medicine  [facility]) were discussed and and offered to patient and she  agrees to start the journey:  A. Whole Foods, Plant-Based Nutrition comprising of fruits and vegetables, plant-based proteins, whole-grain carbohydrates was discussed in detail with the patient.   A list for source of those nutrients were also provided to the patient.  Patient will use only water or unsweetened tea for hydration. B.  The need to stay away from risky substances including alcohol, smoking; obtaining 7 to 9 hours of restorative sleep, at least 150 minutes of moderate intensity exercise weekly, the importance of healthy social connections,  and stress management techniques were discussed. C.  A full color page of  Calorie density of various food groups per pound showing examples of each food groups was provided to the patient.  -In the meantime, she is advised to continue Crestor 20 mg p.o. daily at bedtime, continue lisinopril 10 mg p.o. daily at breakfast, metoprolol 25 mg p.o. daily at breakfast.   Her LDL is 130,  increasing from 90.   - I advised patient to maintain close follow up with [name], NP for primary care needs.  I spent  22  minutes in the care of the patient today including review of labs from Thyroid Function, CMP, and other relevant labs ; imaging/biopsy records (current and previous including abstractions from other facilities); face-to-face time discussing  her lab results and symptoms, medications doses, her options of short and long term treatment based on the latest standards of care / guidelines;   and documenting the encounter.  Margaret Castro  participated in the discussions, expressed understanding, and voiced agreement with the above plans.  All questions were answered to her satisfaction. she is  encouraged to contact clinic should she have any questions or concerns prior to her return visit.    Follow up plan: Return in about 6 months (around 05/13/2023) for Fasting Labs  in AM B4 8.   Marquis Lunch, MD [facility] [address] [place], [place] [address] Phone: [phone]  Fax: [phone]     11/11/2022, 3:37 PM  This note was partially dictated with voice recognition software. Similar sounding words can be transcribed inadequately or may not  be corrected upon review.

## 2023-05-06 LAB — LIPID PANEL
Chol/HDL Ratio: 2.7 {ratio} (ref 0.0–4.4)
Cholesterol, Total: 183 mg/dL (ref 100–199)
HDL: 69 mg/dL (ref >39)
LDL Chol Calc (NIH): 106 mg/dL — ABNORMAL HIGH (ref 0–99)
Triglycerides: 41 mg/dL (ref 0–149)
VLDL Cholesterol Cal: 8 mg/dL (ref 5–40)

## 2023-05-06 LAB — T4, FREE: Free T4: 1.51 ng/dL (ref 0.82–1.77)

## 2023-05-06 LAB — TSH: TSH: 8.71 u[IU]/mL — ABNORMAL HIGH (ref 0.450–4.500)

## 2023-05-13 ENCOUNTER — Encounter: Payer: Self-pay | Admitting: "Endocrinology

## 2023-05-13 ENCOUNTER — Ambulatory Visit: Payer: Medicare PPO | Admitting: "Endocrinology

## 2023-05-13 VITALS — BP 130/88 | HR 80 | Ht 69.5 in | Wt 244.2 lb

## 2023-05-13 DIAGNOSIS — E782 Mixed hyperlipidemia: Secondary | ICD-10-CM

## 2023-05-13 DIAGNOSIS — E890 Postprocedural hypothyroidism: Secondary | ICD-10-CM | POA: Diagnosis not present

## 2023-05-13 MED ORDER — LEVOTHYROXINE SODIUM 88 MCG PO TABS: 88.0000 ug | ORAL_TABLET | Freq: Every day | ORAL | 1 refills | Status: DC

## 2023-05-13 NOTE — Progress Notes (Signed)
05/13/2023, 6:17 PM                                     Endocrinology follow-up note   Subjective:    Patient ID: Margaret Castro, female    DOB: [birth year], PCP Verlee Monte, NP   Past Medical History:  Diagnosis Date   Hyperlipidemia    Hypertension    Past Surgical History:  Procedure Laterality Date   CHOLECYSTECTOMY  03/17/2022   Social History   Socioeconomic History   Marital status: Single    Spouse name: Not on file   Number of children: Not on file   Years of education: Not on file   Highest education level: Not on file  Occupational History   Not on file  Tobacco Use   Smoking status: Never   Smokeless tobacco: Never  Vaping Use   Vaping status: Never Used  Substance and Sexual Activity   Alcohol use: Not Currently   Drug use: Never   Sexual activity: Not on file  Other Topics Concern   Not on file  Social History Narrative   Not on file   Social Determinants of Health   Financial Resource Strain: Not on file  Food Insecurity: Not on file  Transportation Needs: Not on file  Physical Activity: Not on file  Stress: Not on file  Social Connections: Not on file   Outpatient Encounter Medications as of 05/13/2023  Medication Sig   levothyroxine (SYNTHROID) 88 MCG tablet Take 1 tablet (88 mcg total) by mouth daily before breakfast.   lisinopril (ZESTRIL) 10 MG tablet Take 10 mg by mouth daily.   metoprolol succinate (TOPROL-XL) 25 MG 24 hr tablet Take 25 mg by mouth daily as needed.   omeprazole (PRILOSEC) 40 MG capsule Take 40 mg by mouth daily.   rosuvastatin (CRESTOR) 20 MG tablet Take 1 tablet by mouth daily.   [DISCONTINUED] levothyroxine (SYNTHROID) 75 MCG tablet Take 1 tablet (75 mcg total) by mouth daily before breakfast.   No facility-administered encounter medications on file as of 05/13/2023.   ALLERGIES: No Known Allergies  VACCINATION STATUS:  There is no immunization  history on file for this patient.  HPI Margaret Castro is 67 y.o. female who presents today to follow-up for hypothyroidism.  She was given thyroid ablative therapy with I-131 to treat Graves' disease with clinically significant hypothyroidism.  Her treatment was administered in March 2022. -She is currently on 75 mcg p.o. daily before breakfast.   Her previsit thyroid function tests are consistent with slight under replacement.       Her previous symptoms of hyperthyroidism have resolved.  Recently, she was diagnosed with hypertension and hyperlipidemia.  She presented with some weight gain.   She is currently on Crestor 20 mg p.o. nightly, tolerating and benefiting from this medication.  She is also on lisinopril and beta-blocker for hypertension.    She denies palpitations, tremors, heat intolerance.  Her major concern still is inability to lose weight.   -She has 2 sisters with thyroid problems, 1 of them status post thyroidectomy on thyroid hormone replacement.  Another sister with  treatment which appears to be radioactive iodine ablation.  She denies family history of thyroid cancer. -She denies any exposure to neck radiation.   -She denies  dysphagia, shortness of breath, change in her voice.   Review of Systems -Limited as above.    Objective:    BP 130/88   Pulse 80   Ht 5' 9.5" (1.765 m)   Wt 244 lb 3.2 oz (110.8 kg)   BMI 35.55 kg/m   Wt Readings from Last 3 Encounters:  05/13/23 244 lb 3.2 oz (110.8 kg)  11/11/22 236 lb 6.4 oz (107.2 kg)  05/12/22 227 lb 9.6 oz (103.2 kg)    Physical Exam    Recent Results (from the past 2160 hour(s))  Lipid panel     Status: Abnormal   Collection Time: 05/05/23 12:00 AM  Result Value Ref Range   Cholesterol, Total 183 100 - 199 mg/dL   Triglycerides 41 0 - 149 mg/dL   HDL 69 >82 mg/dL   VLDL Cholesterol Cal 8 5 - 40 mg/dL   LDL Chol Calc (NIH) 956 (H) 0 - 99 mg/dL   Chol/HDL Ratio 2.7 0.0 - 4.4 ratio    Comment:                                    T. Chol/HDL Ratio                                             Men  Women                               1/2 Avg.Risk  3.4    3.3                                   Avg.Risk  5.0    4.4                                2X Avg.Risk  9.6    7.1                                3X Avg.Risk 23.4   11.0   TSH     Status: Abnormal   Collection Time: 05/05/23 12:00 AM  Result Value Ref Range   TSH 8.710 (H) 0.450 - 4.500 uIU/mL  T4, free     Status: None   Collection Time: 05/05/23 12:00 AM  Result Value Ref Range   Free T4 1.51 0.82 - 1.77 ng/dL      Assessment & Plan:   1.  RAI induced hypothyroidism  2.  Hyperlipidemia  3.  Hypertension  She is status post RAI thyroid ablation on September 30, 2020.  Her pretreatment work-up confirmed Graves' disease with 52% uptake in 24 hours.  Her previsit thyroid function tests are consistent with slight under replacement.  I discussed and increase under 188 mcg p.o. daily before breakfast.     - We discussed about the correct intake of her thyroid hormone, on empty stomach at fasting, with water, separated by  at least 30 minutes from breakfast and other medications,  and separated by more than 4 hours from calcium, iron, multivitamins, acid reflux medications (PPIs). -Patient is made aware of the fact that thyroid hormone replacement is needed for life, dose to be adjusted by periodic monitoring of thyroid function tests.   In light of her metabolic dysfunction with hyperlipidemia, obesity, hypertension, she remains a good candidate for Medicine.    - she acknowledges that there is a room for improvement in her food and drink choices. - Suggestion is made for her to avoid simple carbohydrates  from her diet including Cakes, Sweet Desserts, Ice Cream, Soda (diet and regular), Sweet Tea, Candies, Chips, Cookies, Store Bought Juices, Alcohol , Artificial Sweeteners,  Coffee Creamer, and "Sugar-free" Products, Lemonade. This will help  patient to have more stable blood glucose profile and potentially avoid unintended weight gain.  The following Lifestyle Medicine recommendations according to American College of Lifestyle Medicine  [facility]) were discussed and and offered to patient and she  agrees to start the journey:  A. Whole Foods, Plant-Based Nutrition comprising of fruits and vegetables, plant-based proteins, whole-grain carbohydrates was discussed in detail with the patient.   A list for source of those nutrients were also provided to the patient.  Patient will use only water or unsweetened tea for hydration. B.  The need to stay away from risky substances including alcohol, smoking; obtaining 7 to 9 hours of restorative sleep, at least 150 minutes of moderate intensity exercise weekly, the importance of healthy social connections,  and stress management techniques were discussed. C.  A full color page of  Calorie density of various food groups per pound showing examples of each food groups was provided to the patient.  -In the meantime, she is benefiting and tolerating Crestor.  I advised her to continue Crestor 20 mg p.o. nightly.  Side effects and precautions discussed with her.  She is also on lisinopril 10 mg p.o. daily at breakfast along with metoprolol 25 mg p.o. daily for blood pressure management.   - I advised patient to maintain close follow up with [name], NP for primary care needs.   I spent  22  minutes in the care of the patient today including review of labs from Thyroid Function, CMP, and other relevant labs ; imaging/biopsy records (current and previous including abstractions from other facilities); face-to-face time discussing  her lab results and symptoms, medications doses, her options of short and long term treatment based on the latest standards of care / guidelines;   and documenting the encounter.  [name]  participated in the discussions, expressed understanding, and voiced agreement  with the above plans.  All questions were answered to her satisfaction. she is encouraged to contact clinic should she have any questions or concerns prior to her return visit.   Follow up plan: Return in about 6 months (around 11/11/2023) for Fasting Labs  in AM B4 8.   Marquis Lunch, MD [facility] [address] [place], [place] [address] Phone: [phone]  Fax: [phone]     05/13/2023, 6:17 PM  This note was partially dictated with voice recognition software. Similar sounding words can be transcribed inadequately or may not  be corrected upon review.

## 2023-11-05 LAB — LIPID PANEL
Chol/HDL Ratio: 2.7 ratio (ref 0.0–4.4)
Cholesterol, Total: 153 mg/dL (ref 100–199)
HDL: 57 mg/dL (ref >39)
LDL Chol Calc (NIH): 83 mg/dL (ref 0–99)
Triglycerides: 65 mg/dL (ref 0–149)
VLDL Cholesterol Cal: 13 mg/dL (ref 5–40)

## 2023-11-05 LAB — T4, FREE: Free T4: 1.68 ng/dL (ref 0.82–1.77)

## 2023-11-05 LAB — TSH: TSH: 1.08 u[IU]/mL (ref 0.450–4.500)

## 2023-11-09 ENCOUNTER — Other Ambulatory Visit: Payer: Self-pay | Admitting: "Endocrinology

## 2023-11-11 ENCOUNTER — Ambulatory Visit: Payer: Medicare PPO | Admitting: "Endocrinology

## 2023-12-06 ENCOUNTER — Encounter: Payer: Self-pay | Admitting: "Endocrinology

## 2023-12-06 ENCOUNTER — Ambulatory Visit: Admitting: "Endocrinology

## 2023-12-06 VITALS — BP 136/82 | HR 64 | Ht 69.5 in | Wt 252.6 lb

## 2023-12-06 DIAGNOSIS — E890 Postprocedural hypothyroidism: Secondary | ICD-10-CM | POA: Diagnosis not present

## 2023-12-06 DIAGNOSIS — E782 Mixed hyperlipidemia: Secondary | ICD-10-CM

## 2023-12-06 MED ORDER — ROSUVASTATIN CALCIUM 20 MG PO TABS: 20.0000 mg | ORAL_TABLET | Freq: Every day | ORAL | 3 refills | Status: AC

## 2023-12-06 MED ORDER — LEVOTHYROXINE SODIUM 88 MCG PO TABS: 88.0000 ug | ORAL_TABLET | Freq: Every day | ORAL | 3 refills | Status: AC

## 2023-12-06 NOTE — Progress Notes (Signed)
 12/06/2023, 1:06 PM                                     Endocrinology follow-up note   Subjective:    Patient ID: Margaret Castro, female    DOB: Feb 08, 1956, PCP Latricia Poles, NP   Past Medical History:  Diagnosis Date   Hyperlipidemia    Hypertension    Past Surgical History:  Procedure Laterality Date   CHOLECYSTECTOMY  03/17/2022   Social History   Socioeconomic History   Marital status: Single    Spouse name: Not on file   Number of children: Not on file   Years of education: Not on file   Highest education level: Not on file  Occupational History   Not on file  Tobacco Use   Smoking status: Never   Smokeless tobacco: Never  Vaping Use   Vaping status: Never Used  Substance and Sexual Activity   Alcohol use: Not Currently   Drug use: Never   Sexual activity: Not on file  Other Topics Concern   Not on file  Social History Narrative   Not on file   Social Drivers of Health   Financial Resource Strain: Not on file  Food Insecurity: Not on file  Transportation Needs: Not on file  Physical Activity: Not on file  Stress: Not on file  Social Connections: Not on file   Outpatient Encounter Medications as of 12/06/2023  Medication Sig   levothyroxine  (SYNTHROID ) 88 MCG tablet Take 1 tablet (88 mcg total) by mouth daily before breakfast.   lisinopril (ZESTRIL) 10 MG tablet Take 10 mg by mouth daily.   metoprolol  succinate (TOPROL -XL) 25 MG 24 hr tablet Take 25 mg by mouth daily as needed.   omeprazole (PRILOSEC) 40 MG capsule Take 40 mg by mouth daily.   rosuvastatin (CRESTOR) 20 MG tablet Take 1 tablet (20 mg total) by mouth daily.   [DISCONTINUED] levothyroxine  (SYNTHROID ) 88 MCG tablet TAKE 1 TABLET BY MOUTH DAILY BEFORE BREAKFAST   [DISCONTINUED] rosuvastatin (CRESTOR) 20 MG tablet Take 1 tablet by mouth daily.   No facility-administered encounter medications on file as of 12/06/2023.    ALLERGIES: No Known Allergies  VACCINATION STATUS:  There is no immunization history on file for this patient.  HPI Margaret Castro is 68 y.o. female who presents today to follow-up for hypothyroidism.  She was given thyroid  ablative therapy with I-131 to treat Graves' disease with clinically significant hypothyroidism.  Her treatment was administered in March 2022. -She is currently on 88 mcg p.o. daily before breakfast.  Her previsit thyroid  function tests are consistent with appropriate replacement.         Her previous symptoms of hyperthyroidism have resolved.  Recently, she was diagnosed with hypertension and hyperlipidemia.  She continues to tolerate Crestor 20 mg p.o. nightly with significant improvement in her lipid profile-see below.  She presented with some weight gain.     She is also on lisinopril and beta-blocker for hypertension.    She denies palpitations, tremors, heat intolerance.  Her major concern still is inability to lose weight.   -She has 2  sisters with thyroid  problems, 1 of them status post thyroidectomy on thyroid  hormone replacement.  Another sister with treatment which appears to be radioactive iodine ablation.  She denies family history of thyroid  cancer. -She denies any exposure to neck radiation.   -She denies  dysphagia, shortness of breath, change in her voice.   Review of Systems -Limited as above.    Objective:    BP 136/82   Pulse 64   Ht 5' 9.5" (1.765 m)   Wt 252 lb 9.6 oz (114.6 kg)   BMI 36.77 kg/m   Wt Readings from Last 3 Encounters:  12/06/23 252 lb 9.6 oz (114.6 kg)  05/13/23 244 lb 3.2 oz (110.8 kg)  11/11/22 236 lb 6.4 oz (107.2 kg)    Physical Exam    Recent Results (from the past 2160 hours)  Lipid panel     Status: None   Collection Time: 11/04/23 12:00 AM  Result Value Ref Range   Cholesterol, Total 153 100 - 199 mg/dL   Triglycerides 65 0 - 149 mg/dL   HDL 57 >16 mg/dL   VLDL Cholesterol Cal 13 5 - 40 mg/dL    LDL Chol Calc (NIH) 83 0 - 99 mg/dL   Chol/HDL Ratio 2.7 0.0 - 4.4 ratio    Comment:                                   T. Chol/HDL Ratio                                             Men  Women                               1/2 Avg.Risk  3.4    3.3                                   Avg.Risk  5.0    4.4                                2X Avg.Risk  9.6    7.1                                3X Avg.Risk 23.4   11.0   TSH     Status: None   Collection Time: 11/04/23 12:00 AM  Result Value Ref Range   TSH 1.080 0.450 - 4.500 uIU/mL  T4, free     Status: None   Collection Time: 11/04/23 12:00 AM  Result Value Ref Range   Free T4 1.68 0.82 - 1.77 ng/dL      Assessment & Plan:   1.  RAI induced hypothyroidism  2.  Hyperlipidemia  3.  Hypertension  She is status post RAI thyroid  ablation on September 30, 2020.  Her pretreatment work-up confirmed Graves' disease with 52% uptake in 24 hours.  Her previsit thyroid  function tests are consistent with appropriate replacement.  I advised her to continue Synthroid  88 mcg p.o. daily before breakfast.     - We discussed about  the correct intake of her thyroid  hormone, on empty stomach at fasting, with water, separated by at least 30 minutes from breakfast and other medications,  and separated by more than 4 hours from calcium, iron, multivitamins, acid reflux medications (PPIs). -Patient is made aware of the fact that thyroid  hormone replacement is needed for life, dose to be adjusted by periodic monitoring of thyroid  function tests.    In light of her metabolic dysfunction with hyperlipidemia, obesity, hypertension, she remains a good candidate for Medicine.    - she acknowledges that there is a room for improvement in her food and drink choices. - Suggestion is made for her to avoid simple carbohydrates  from her diet including Cakes, Sweet Desserts, Ice Cream, Soda (diet and regular), Sweet Tea, Candies, Chips, Cookies, Store Bought Juices, Alcohol ,  Artificial Sweeteners,  Coffee Creamer, and "Sugar-free" Products, Lemonade. This will help patient to have more stable blood glucose profile and potentially avoid unintended weight gain.  The following Lifestyle Medicine recommendations according to American College of Lifestyle Medicine  [facility]) were discussed and and offered to patient and she  agrees to start the journey:  A. Whole Foods, Plant-Based Nutrition comprising of fruits and vegetables, plant-based proteins, whole-grain carbohydrates was discussed in detail with the patient.   A list for source of those nutrients were also provided to the patient.  Patient will use only water or unsweetened tea for hydration. B.  The need to stay away from risky substances including alcohol, smoking; obtaining 7 to 9 hours of restorative sleep, at least 150 minutes of moderate intensity exercise weekly, the importance of healthy social connections,  and stress management techniques were discussed. C.  A full color page of  Calorie density of various food groups per pound showing examples of each food groups was provided to the patient.  -In the meantime, she is benefiting and tolerating Crestor-with her LDL improving to 83.  She is tolerating and benefiting from Crestor, advised to continue Crestor 20 mg p.o. nightly.     Side effects and precautions discussed with her.  She is also on lisinopril 10 mg p.o. daily at breakfast along with metoprolol  25 mg p.o. daily for blood pressure management.   - I advised patient to maintain close follow up with Koleen Perna Arvie Latus, NP for primary care needs.   I spent  22  minutes in the care of the patient today including review of labs from Thyroid  Function, CMP, and other relevant labs ; imaging/biopsy records (current and previous including abstractions from other facilities); face-to-face time discussing  her lab results and symptoms, medications doses, her options of short and long term treatment based on the latest  standards of care / guidelines;   and documenting the encounter.  Lonnie Roberts  participated in the discussions, expressed understanding, and voiced agreement with the above plans.  All questions were answered to her satisfaction. she is encouraged to contact clinic should she have any questions or concerns prior to her return visit.    Follow up plan: Return in about 1 year (around 12/05/2024) for Fasting Labs  in AM B4 8.   Kalvin Orf, MD [facility] [address] [place], [place] [address] Phone: [phone]  Fax: [phone]     12/06/2023, 1:06 PM  This note was partially dictated with voice recognition software. Similar sounding words can be transcribed inadequately or may not  be corrected upon review.

## 2024-12-05 ENCOUNTER — Ambulatory Visit: Admitting: "Endocrinology
